# Patient Record
Sex: Male | Born: 1945 | Race: White | Hispanic: No | Marital: Married | Smoking: Former smoker
Health system: Southern US, Community
[De-identification: ages and names within clinical notes are randomized; demographics above are authoritative.]

## PROBLEM LIST (undated history)

## (undated) DIAGNOSIS — R42 Dizziness and giddiness: Secondary | ICD-10-CM

## (undated) DIAGNOSIS — Z974 Presence of external hearing-aid: Secondary | ICD-10-CM

## (undated) DIAGNOSIS — E785 Hyperlipidemia, unspecified: Secondary | ICD-10-CM

## (undated) DIAGNOSIS — J449 Chronic obstructive pulmonary disease, unspecified: Secondary | ICD-10-CM

## (undated) DIAGNOSIS — I10 Essential (primary) hypertension: Secondary | ICD-10-CM

## (undated) DIAGNOSIS — K219 Gastro-esophageal reflux disease without esophagitis: Secondary | ICD-10-CM

## (undated) DIAGNOSIS — Z972 Presence of dental prosthetic device (complete) (partial): Secondary | ICD-10-CM

## (undated) DIAGNOSIS — M1990 Unspecified osteoarthritis, unspecified site: Secondary | ICD-10-CM

## (undated) HISTORY — PX: COLONOSCOPY: SHX174

## (undated) HISTORY — PX: HERNIA REPAIR: SHX51

## (undated) HISTORY — PX: FOOT SURGERY: SHX648

## (undated) HISTORY — PX: CARDIAC CATHETERIZATION: SHX172

---

## 2003-11-16 ENCOUNTER — Ambulatory Visit: Payer: Self-pay | Admitting: Internal Medicine

## 2003-11-29 ENCOUNTER — Other Ambulatory Visit: Payer: Self-pay

## 2003-12-05 ENCOUNTER — Ambulatory Visit: Payer: Self-pay | Admitting: Unknown Physician Specialty

## 2005-07-18 ENCOUNTER — Ambulatory Visit: Payer: Self-pay | Admitting: Unknown Physician Specialty

## 2005-07-24 ENCOUNTER — Ambulatory Visit: Payer: Self-pay | Admitting: Cardiology

## 2007-12-01 ENCOUNTER — Ambulatory Visit: Payer: Self-pay | Admitting: Internal Medicine

## 2009-03-15 ENCOUNTER — Ambulatory Visit: Payer: Self-pay | Admitting: Gastroenterology

## 2010-03-29 ENCOUNTER — Ambulatory Visit: Payer: Self-pay | Admitting: Internal Medicine

## 2015-04-12 DIAGNOSIS — R739 Hyperglycemia, unspecified: Secondary | ICD-10-CM | POA: Diagnosis not present

## 2015-04-19 DIAGNOSIS — I2510 Atherosclerotic heart disease of native coronary artery without angina pectoris: Secondary | ICD-10-CM | POA: Diagnosis not present

## 2015-04-19 DIAGNOSIS — R739 Hyperglycemia, unspecified: Secondary | ICD-10-CM | POA: Diagnosis not present

## 2015-04-19 DIAGNOSIS — M25552 Pain in left hip: Secondary | ICD-10-CM | POA: Diagnosis not present

## 2015-04-19 DIAGNOSIS — I10 Essential (primary) hypertension: Secondary | ICD-10-CM | POA: Diagnosis not present

## 2015-04-19 DIAGNOSIS — J449 Chronic obstructive pulmonary disease, unspecified: Secondary | ICD-10-CM | POA: Diagnosis not present

## 2015-04-25 DIAGNOSIS — M25422 Effusion, left elbow: Secondary | ICD-10-CM | POA: Diagnosis not present

## 2015-04-25 DIAGNOSIS — M19022 Primary osteoarthritis, left elbow: Secondary | ICD-10-CM | POA: Diagnosis not present

## 2015-04-25 DIAGNOSIS — M7022 Olecranon bursitis, left elbow: Secondary | ICD-10-CM | POA: Diagnosis not present

## 2015-08-09 DIAGNOSIS — I10 Essential (primary) hypertension: Secondary | ICD-10-CM | POA: Diagnosis not present

## 2015-08-09 DIAGNOSIS — R739 Hyperglycemia, unspecified: Secondary | ICD-10-CM | POA: Diagnosis not present

## 2015-08-12 HISTORY — PX: SHOULDER ARTHROSCOPY WITH ROTATOR CUFF REPAIR: SHX5685

## 2015-08-22 DIAGNOSIS — Z125 Encounter for screening for malignant neoplasm of prostate: Secondary | ICD-10-CM | POA: Diagnosis not present

## 2015-08-22 DIAGNOSIS — J449 Chronic obstructive pulmonary disease, unspecified: Secondary | ICD-10-CM | POA: Diagnosis not present

## 2015-08-22 DIAGNOSIS — I2510 Atherosclerotic heart disease of native coronary artery without angina pectoris: Secondary | ICD-10-CM | POA: Diagnosis not present

## 2015-08-22 DIAGNOSIS — R739 Hyperglycemia, unspecified: Secondary | ICD-10-CM | POA: Diagnosis not present

## 2015-08-22 DIAGNOSIS — I10 Essential (primary) hypertension: Secondary | ICD-10-CM | POA: Diagnosis not present

## 2015-08-22 DIAGNOSIS — E7800 Pure hypercholesterolemia, unspecified: Secondary | ICD-10-CM | POA: Diagnosis not present

## 2015-08-22 DIAGNOSIS — Z1211 Encounter for screening for malignant neoplasm of colon: Secondary | ICD-10-CM | POA: Diagnosis not present

## 2015-08-22 DIAGNOSIS — Z0001 Encounter for general adult medical examination with abnormal findings: Secondary | ICD-10-CM | POA: Diagnosis not present

## 2015-08-23 DIAGNOSIS — X32XXXA Exposure to sunlight, initial encounter: Secondary | ICD-10-CM | POA: Diagnosis not present

## 2015-08-23 DIAGNOSIS — D0439 Carcinoma in situ of skin of other parts of face: Secondary | ICD-10-CM | POA: Diagnosis not present

## 2015-08-23 DIAGNOSIS — L570 Actinic keratosis: Secondary | ICD-10-CM | POA: Diagnosis not present

## 2015-08-23 DIAGNOSIS — D485 Neoplasm of uncertain behavior of skin: Secondary | ICD-10-CM | POA: Diagnosis not present

## 2015-08-23 DIAGNOSIS — D1801 Hemangioma of skin and subcutaneous tissue: Secondary | ICD-10-CM | POA: Diagnosis not present

## 2015-09-15 DIAGNOSIS — D0439 Carcinoma in situ of skin of other parts of face: Secondary | ICD-10-CM | POA: Diagnosis not present

## 2016-01-17 DIAGNOSIS — Z85828 Personal history of other malignant neoplasm of skin: Secondary | ICD-10-CM | POA: Diagnosis not present

## 2016-01-17 DIAGNOSIS — Z08 Encounter for follow-up examination after completed treatment for malignant neoplasm: Secondary | ICD-10-CM | POA: Diagnosis not present

## 2016-01-17 DIAGNOSIS — X32XXXA Exposure to sunlight, initial encounter: Secondary | ICD-10-CM | POA: Diagnosis not present

## 2016-01-17 DIAGNOSIS — L821 Other seborrheic keratosis: Secondary | ICD-10-CM | POA: Diagnosis not present

## 2016-01-17 DIAGNOSIS — L570 Actinic keratosis: Secondary | ICD-10-CM | POA: Diagnosis not present

## 2016-02-21 DIAGNOSIS — J449 Chronic obstructive pulmonary disease, unspecified: Secondary | ICD-10-CM | POA: Diagnosis not present

## 2016-02-21 DIAGNOSIS — I2510 Atherosclerotic heart disease of native coronary artery without angina pectoris: Secondary | ICD-10-CM | POA: Diagnosis not present

## 2016-02-21 DIAGNOSIS — I10 Essential (primary) hypertension: Secondary | ICD-10-CM | POA: Diagnosis not present

## 2016-02-21 DIAGNOSIS — R739 Hyperglycemia, unspecified: Secondary | ICD-10-CM | POA: Diagnosis not present

## 2016-02-21 DIAGNOSIS — Z125 Encounter for screening for malignant neoplasm of prostate: Secondary | ICD-10-CM | POA: Diagnosis not present

## 2016-03-25 DIAGNOSIS — R070 Pain in throat: Secondary | ICD-10-CM | POA: Diagnosis not present

## 2016-03-25 DIAGNOSIS — J039 Acute tonsillitis, unspecified: Secondary | ICD-10-CM | POA: Diagnosis not present

## 2016-09-19 DIAGNOSIS — I2510 Atherosclerotic heart disease of native coronary artery without angina pectoris: Secondary | ICD-10-CM | POA: Diagnosis not present

## 2016-09-27 DIAGNOSIS — R748 Abnormal levels of other serum enzymes: Secondary | ICD-10-CM | POA: Diagnosis not present

## 2016-09-27 DIAGNOSIS — Z0001 Encounter for general adult medical examination with abnormal findings: Secondary | ICD-10-CM | POA: Diagnosis not present

## 2016-09-27 DIAGNOSIS — I2510 Atherosclerotic heart disease of native coronary artery without angina pectoris: Secondary | ICD-10-CM | POA: Diagnosis not present

## 2017-01-01 DIAGNOSIS — E7800 Pure hypercholesterolemia, unspecified: Secondary | ICD-10-CM | POA: Diagnosis not present

## 2017-01-01 DIAGNOSIS — J449 Chronic obstructive pulmonary disease, unspecified: Secondary | ICD-10-CM | POA: Diagnosis not present

## 2017-01-01 DIAGNOSIS — Z8679 Personal history of other diseases of the circulatory system: Secondary | ICD-10-CM | POA: Diagnosis not present

## 2017-01-01 DIAGNOSIS — I10 Essential (primary) hypertension: Secondary | ICD-10-CM | POA: Diagnosis not present

## 2017-01-13 DIAGNOSIS — J019 Acute sinusitis, unspecified: Secondary | ICD-10-CM | POA: Diagnosis not present

## 2017-01-15 DIAGNOSIS — Z85828 Personal history of other malignant neoplasm of skin: Secondary | ICD-10-CM | POA: Diagnosis not present

## 2017-01-15 DIAGNOSIS — X32XXXA Exposure to sunlight, initial encounter: Secondary | ICD-10-CM | POA: Diagnosis not present

## 2017-01-15 DIAGNOSIS — D0462 Carcinoma in situ of skin of left upper limb, including shoulder: Secondary | ICD-10-CM | POA: Diagnosis not present

## 2017-01-15 DIAGNOSIS — D485 Neoplasm of uncertain behavior of skin: Secondary | ICD-10-CM | POA: Diagnosis not present

## 2017-01-15 DIAGNOSIS — L570 Actinic keratosis: Secondary | ICD-10-CM | POA: Diagnosis not present

## 2017-01-15 DIAGNOSIS — L821 Other seborrheic keratosis: Secondary | ICD-10-CM | POA: Diagnosis not present

## 2017-01-15 DIAGNOSIS — Z08 Encounter for follow-up examination after completed treatment for malignant neoplasm: Secondary | ICD-10-CM | POA: Diagnosis not present

## 2017-01-22 DIAGNOSIS — D045 Carcinoma in situ of skin of trunk: Secondary | ICD-10-CM | POA: Diagnosis not present

## 2017-09-05 ENCOUNTER — Other Ambulatory Visit: Payer: Self-pay | Admitting: Family Medicine

## 2017-09-05 DIAGNOSIS — R748 Abnormal levels of other serum enzymes: Secondary | ICD-10-CM

## 2017-09-08 ENCOUNTER — Other Ambulatory Visit: Payer: Self-pay | Admitting: Family Medicine

## 2017-09-08 DIAGNOSIS — R748 Abnormal levels of other serum enzymes: Secondary | ICD-10-CM

## 2017-09-12 ENCOUNTER — Ambulatory Visit
Admission: RE | Admit: 2017-09-12 | Discharge: 2017-09-12 | Disposition: A | Discharge status: Home / Self Care | Payer: Medicare Other | Source: Ambulatory Visit | Attending: Family Medicine | Admitting: Family Medicine

## 2017-09-12 DIAGNOSIS — R748 Abnormal levels of other serum enzymes: Secondary | ICD-10-CM | POA: Insufficient documentation

## 2017-11-13 ENCOUNTER — Encounter: Payer: Self-pay | Admitting: *Deleted

## 2017-11-14 ENCOUNTER — Encounter
Admission: RE | Disposition: A | Discharge status: Home / Self Care | Payer: Self-pay | Source: Ambulatory Visit | Attending: Gastroenterology

## 2017-11-14 ENCOUNTER — Ambulatory Visit: Payer: Medicare Other | Admitting: Anesthesiology

## 2017-11-14 ENCOUNTER — Encounter: Payer: Self-pay | Admitting: *Deleted

## 2017-11-14 ENCOUNTER — Ambulatory Visit
Admission: RE | Admit: 2017-11-14 | Discharge: 2017-11-14 | Disposition: A | Discharge status: Home / Self Care | Payer: Medicare Other | Source: Ambulatory Visit | Attending: Gastroenterology | Admitting: Gastroenterology

## 2017-11-14 DIAGNOSIS — Z7982 Long term (current) use of aspirin: Secondary | ICD-10-CM | POA: Diagnosis not present

## 2017-11-14 DIAGNOSIS — Z79899 Other long term (current) drug therapy: Secondary | ICD-10-CM | POA: Diagnosis not present

## 2017-11-14 DIAGNOSIS — K219 Gastro-esophageal reflux disease without esophagitis: Secondary | ICD-10-CM | POA: Insufficient documentation

## 2017-11-14 DIAGNOSIS — Z800 Family history of malignant neoplasm of digestive organs: Secondary | ICD-10-CM | POA: Insufficient documentation

## 2017-11-14 DIAGNOSIS — D125 Benign neoplasm of sigmoid colon: Secondary | ICD-10-CM | POA: Insufficient documentation

## 2017-11-14 DIAGNOSIS — D123 Benign neoplasm of transverse colon: Secondary | ICD-10-CM | POA: Diagnosis not present

## 2017-11-14 DIAGNOSIS — Z1211 Encounter for screening for malignant neoplasm of colon: Secondary | ICD-10-CM | POA: Diagnosis not present

## 2017-11-14 DIAGNOSIS — Z87891 Personal history of nicotine dependence: Secondary | ICD-10-CM | POA: Insufficient documentation

## 2017-11-14 DIAGNOSIS — I10 Essential (primary) hypertension: Secondary | ICD-10-CM | POA: Diagnosis not present

## 2017-11-14 DIAGNOSIS — J449 Chronic obstructive pulmonary disease, unspecified: Secondary | ICD-10-CM | POA: Diagnosis not present

## 2017-11-14 DIAGNOSIS — E785 Hyperlipidemia, unspecified: Secondary | ICD-10-CM | POA: Diagnosis not present

## 2017-11-14 DIAGNOSIS — K5730 Diverticulosis of large intestine without perforation or abscess without bleeding: Secondary | ICD-10-CM | POA: Insufficient documentation

## 2017-11-14 HISTORY — DX: Gastro-esophageal reflux disease without esophagitis: K21.9

## 2017-11-14 HISTORY — DX: Hyperlipidemia, unspecified: E78.5

## 2017-11-14 HISTORY — DX: Unspecified osteoarthritis, unspecified site: M19.90

## 2017-11-14 HISTORY — DX: Essential (primary) hypertension: I10

## 2017-11-14 HISTORY — PX: COLONOSCOPY WITH PROPOFOL: SHX5780

## 2017-11-14 HISTORY — DX: Chronic obstructive pulmonary disease, unspecified: J44.9

## 2017-11-14 SURGERY — COLONOSCOPY WITH PROPOFOL
Anesthesia: General

## 2017-11-14 MED ORDER — PROPOFOL 500 MG/50ML IV EMUL
INTRAVENOUS | Status: AC
  Filled 2017-11-14: qty 50

## 2017-11-14 MED ORDER — PROPOFOL 500 MG/50ML IV EMUL
INTRAVENOUS | Status: DC | PRN
  Administered 2017-11-14: 180 ug/kg/min via INTRAVENOUS

## 2017-11-14 MED ORDER — OXYMETAZOLINE HCL 0.05 % NA SOLN
NASAL | Status: AC
  Filled 2017-11-14: qty 15

## 2017-11-14 MED ORDER — FENTANYL CITRATE (PF) 100 MCG/2ML IJ SOLN
INTRAMUSCULAR | Status: AC
  Filled 2017-11-14: qty 2

## 2017-11-14 MED ORDER — LIDOCAINE HCL (PF) 1 % IJ SOLN
2.0000 mL | Freq: Once | INTRAMUSCULAR | Status: AC
  Administered 2017-11-14: 0.3 mL via INTRADERMAL

## 2017-11-14 MED ORDER — LIDOCAINE HCL (PF) 2 % IJ SOLN
INTRAMUSCULAR | Status: AC
  Filled 2017-11-14: qty 10

## 2017-11-14 MED ORDER — FENTANYL CITRATE (PF) 100 MCG/2ML IJ SOLN
INTRAMUSCULAR | Status: DC | PRN
  Administered 2017-11-14 (×2): 50 ug via INTRAVENOUS

## 2017-11-14 MED ORDER — PROPOFOL 10 MG/ML IV BOLUS
INTRAVENOUS | Status: DC | PRN
  Administered 2017-11-14: 100 mg via INTRAVENOUS

## 2017-11-14 MED ORDER — SODIUM CHLORIDE 0.9 % IV SOLN
INTRAVENOUS | Status: DC
  Administered 2017-11-14: 1000 mL via INTRAVENOUS
  Administered 2017-11-14: 10:00:00 via INTRAVENOUS

## 2017-11-14 MED ORDER — LIDOCAINE 2% (20 MG/ML) 5 ML SYRINGE
INTRAMUSCULAR | Status: DC | PRN
  Administered 2017-11-14: 40 mg via INTRAVENOUS

## 2017-11-14 MED ORDER — LIDOCAINE HCL (PF) 1 % IJ SOLN
INTRAMUSCULAR | Status: AC
  Administered 2017-11-14: 0.3 mL via INTRADERMAL
  Filled 2017-11-14: qty 2

## 2017-11-14 NOTE — Anesthesia Post-op Follow-up Note (Signed)
Anesthesia QCDR form completed.        

## 2017-11-14 NOTE — H&P (Signed)
Outpatient short stay form Pre-procedure 11/14/2017 10:08 AM John Sails MD  Primary Physician: Dr Dion Body  Reason for visit: Colonoscopy  History of present illness: Patient is a 72 year old male presenting today as above.  There is a family history of colon cancer in a primary relative, brother.  Patient himself has never had polyps on previous procedures.  He tolerated his prep well.  He does take a daily 81 mg aspirin that he has not taken for the past 2 days.  He denies use of any other aspirin products or blood thinning agent.    Current Facility-Administered Medications:  .  0.9 %  sodium chloride infusion, , Intravenous, Continuous, John Sails, MD, Last Rate: 20 mL/hr at 11/14/17 0908  Medications Prior to Admission  Medication Sig Dispense Refill Last Dose  . amLODipine (NORVASC) 10 MG tablet Take 10 mg by mouth daily.     Marland Kitchen aspirin EC 81 MG tablet Take 81 mg by mouth daily.   11/12/2017  . HM OMEGA-3-6-9 FATTY ACIDS CAPS Take 1,000 mg by mouth daily.     Marland Kitchen losartan (COZAAR) 100 MG tablet Take 100 mg by mouth daily.     . simvastatin (ZOCOR) 10 MG tablet Take 10 mg by mouth daily.        Allergies  Allergen Reactions  . Shellfish Allergy      Past Medical History:  Diagnosis Date  . Arthritis   . COPD (chronic obstructive pulmonary disease) ([place])   . GERD (gastroesophageal reflux disease)   . Hyperlipidemia   . Hypertension     Review of systems:      Physical Exam    Heart and lungs: Regular rate and rhythm without rub or gallop lungs are bilaterally clear    HEENT: Normocephalic atraumatic eyes are anicteric    Other:    Pertinant exam for procedure: Soft nontender nondistended bowel sounds positive normoactive    Planned proceedures: Colonoscopy and indicated procedures. I have discussed the risks benefits and complications of procedures to include not limited to bleeding, infection, perforation and the risk of sedation and the  patient wishes to proceed.    John Sails, MD Gastroenterology 11/14/2017  10:08 AM

## 2017-11-14 NOTE — Op Note (Addendum)
[facility] Gastroenterology Patient Name: John Mueller Procedure Date: 11/14/2017 10:14 AM MRN: [id number] Account #: [id number] Date of Birth: [birth year] Admit Type: Outpatient Age: 72 Room: [facility] ENDO ROOM 3 Gender: Male Note Status: Finalized Procedure:            Colonoscopy Indications:          Family history of colon cancer in a first-degree                        relative Providers:            Lollie Sails, MD Referring MD:         Dion Body (Referring MD) Medicines:            Monitored Anesthesia Care Complications:        No immediate complications. Procedure:            Pre-Anesthesia Assessment:                       - ASA Grade Assessment: III - A patient with severe                        systemic disease.                       After obtaining informed consent, the colonoscope was                        passed under direct vision. Throughout the procedure,                        the patient's blood pressure, pulse, and oxygen                        saturations were monitored continuously. The                        Colonoscope was introduced through the anus and                        advanced to the the cecum, identified by appendiceal                        orifice and ileocecal valve. The colonoscopy was                        performed with moderate difficulty. The patient                        tolerated the procedure well. The quality of the bowel                        preparation was good. Findings:      Three semi-pedunculated polyps were found in the transverse colon. The       polyps were 7 to 10 mm in size. These polyps were removed with a cold       snare. Resection and retrieval were complete.      Four sessile and semi-pedunculated polyps were found in the proximal       ascending colon. The polyps were 2 to 12 mm in size. These polyps were  removed with a cold snare. Resection and retrieval were complete.      A 2  mm polyp was found in the transverse colon. The polyp was sessile.       The polyp was removed with a cold biopsy forceps. Resection and       retrieval were complete.      A 5 mm polyp was found in the proximal sigmoid colon. The polyp was       pedunculated. The polyp was removed with a cold snare. Resection and       retrieval were complete.      Many small and large-mouthed diverticula were found in the sigmoid colon       and descending colon.      The retroflexed view of the distal rectum and anal verge was normal and       showed no anal or rectal abnormalities.      The digital rectal exam was normal. Impression:           - Three 7 to 10 mm polyps in the transverse colon,                        removed with a cold snare. Resected and retrieved.                       - Four 2 to 12 mm polyps in the proximal ascending                        colon, removed with a cold snare. Resected and                        retrieved.                       - One 2 mm polyp in the transverse colon, removed with                        a cold biopsy forceps. Resected and retrieved.                       - One 5 mm polyp in the proximal sigmoid colon, removed                        with a cold snare. Resected and retrieved.                       - Diverticulosis in the sigmoid colon and in the                        descending colon.                       - The distal rectum and anal verge are normal on                        retroflexion view. Recommendation:       - Await pathology results.                       - Telephone GI clinic for pathology results in 1 week. Procedure Code(s):    --- Professional ---  [phone], Colonoscopy, flexible; with removal of tumor(s),                        polyp(s), or other lesion(s) by snare technique                       45380, 59, Colonoscopy, flexible; with biopsy, single                        or multiple CPT copyright 2017 American  Medical Association. All rights reserved. The codes documented in this report are preliminary and upon coder review may  be revised to meet current compliance requirements. Lollie Sails, MD 11/14/2017 11:04:14 AM This report has been signed electronically. Number of Addenda: 0 Note Initiated On: 11/14/2017 10:14 AM Scope Withdrawal Time: 0 hours 23 minutes 46 seconds  Total Procedure Duration: 0 hours 37 minutes 7 seconds       [facility]

## 2017-11-14 NOTE — Transfer of Care (Signed)
Immediate Anesthesia Transfer of Care Note  Patient: John Mueller  Procedure(s) Performed: COLONOSCOPY WITH PROPOFOL (N/A )  Patient Location: PACU and Endoscopy Unit  Anesthesia Type:General  Level of Consciousness: drowsy  Airway & Oxygen Therapy: Patient Spontanous Breathing and Patient connected to nasal cannula oxygen  Post-op Assessment: Report given to RN and Post -op Vital signs reviewed and stable  Post vital signs: Reviewed and stable  Last Vitals:  Vitals Value Taken Time  BP    Temp    Pulse    Resp    SpO2      Last Pain:  Vitals:   11/14/17 0856  TempSrc: Tympanic  PainSc: 0-No pain         Complications: No apparent anesthesia complications

## 2017-11-14 NOTE — Anesthesia Preprocedure Evaluation (Signed)
Anesthesia Evaluation  Patient identified by MRN, date of birth, ID band Patient awake    Reviewed: Allergy & Precautions, H&P , NPO status , Patient's Chart, lab work & pertinent test results, reviewed documented beta blocker date and time   History of Anesthesia Complications Negative for: history of anesthetic complications  Airway Mallampati: I  TM Distance: >3 FB Neck ROM: full    Dental  (+) Dental Advidsory Given, Edentulous Upper, Edentulous Lower, Upper Dentures, Lower Dentures   Pulmonary neg shortness of breath, COPD, neg recent URI, former smoker,           Cardiovascular Exercise Tolerance: Good hypertension, (-) angina(-) CAD, (-) Past MI, (-) Cardiac Stents and (-) CABG (-) dysrhythmias (-) Valvular Problems/Murmurs     Neuro/Psych negative neurological ROS  negative psych ROS   GI/Hepatic Neg liver ROS, GERD  ,  Endo/Other  negative endocrine ROS  Renal/GU negative Renal ROS  negative genitourinary   Musculoskeletal   Abdominal   Peds  Hematology negative hematology ROS (+)   Anesthesia Other Findings Past Medical History: No date: Arthritis No date: COPD (chronic obstructive pulmonary disease) ([facility]) No date: GERD (gastroesophageal reflux disease) No date: Hyperlipidemia No date: Hypertension   Reproductive/Obstetrics negative OB ROS                             Anesthesia Physical Anesthesia Plan  ASA: II  Anesthesia Plan: General   Post-op Pain Management:    Induction: Intravenous  PONV Risk Score and Plan: 2 and Propofol infusion and TIVA  Airway Management Planned: Natural Airway and Nasal Cannula  Additional Equipment:   Intra-op Plan:   Post-operative Plan:   Informed Consent: I have reviewed the patients History and Physical, chart, labs and discussed the procedure including the risks, benefits and alternatives for the proposed anesthesia with the  patient or authorized representative who has indicated his/her understanding and acceptance.   Dental Advisory Given  Plan Discussed with: Anesthesiologist, CRNA and Surgeon  Anesthesia Plan Comments:         Anesthesia Quick Evaluation

## 2017-11-15 ENCOUNTER — Encounter: Payer: Self-pay | Admitting: Gastroenterology

## 2017-11-16 NOTE — Anesthesia Postprocedure Evaluation (Signed)
Anesthesia Post Note  Patient: MIROSLAV GIN  Procedure(s) Performed: COLONOSCOPY WITH PROPOFOL (N/A )  Patient location during evaluation: Endoscopy Anesthesia Type: General Level of consciousness: awake and alert Pain management: pain level controlled Vital Signs Assessment: post-procedure vital signs reviewed and stable Respiratory status: spontaneous breathing, nonlabored ventilation, respiratory function stable and patient connected to nasal cannula oxygen Cardiovascular status: blood pressure returned to baseline and stable Postop Assessment: no apparent nausea or vomiting Anesthetic complications: no     Last Vitals:  Vitals:   11/14/17 1137 11/14/17 1147  BP: (!) 160/90 (!) 157/93  Pulse: (!) 54 (!) 55  Resp: 10 13  Temp:    SpO2: 100% 100%    Last Pain:  Vitals:   11/15/17 1206  TempSrc:   PainSc: 0-No pain                 Martha Clan

## 2017-11-18 LAB — SURGICAL PATHOLOGY

## 2018-11-21 IMAGING — US US ABDOMEN LIMITED
1 series · 14 of 25 positions shown · non-contrast
Comparison: None.

CLINICAL DATA: Elevated liver enzymes.

EXAM:
ULTRASOUND ABDOMEN LIMITED RIGHT UPPER QUADRANT

[Series 1: us abdomen limited · 0.25mm/px · 14 of 99 slices shown]
[im 1/99]
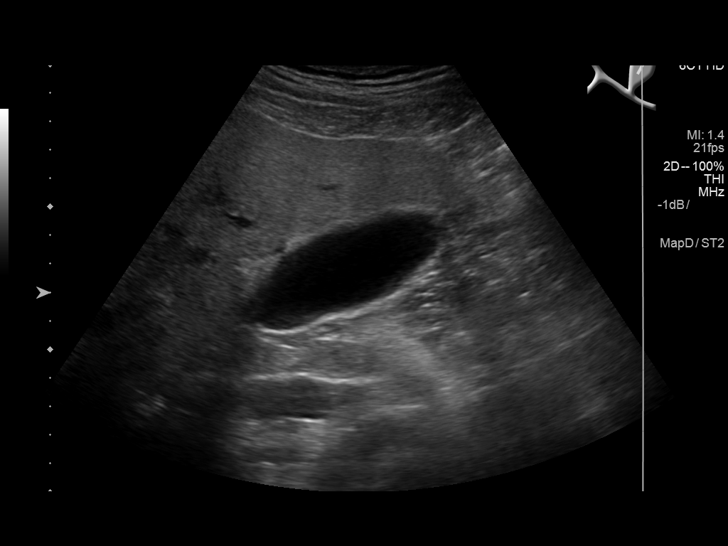
[im 9/99]
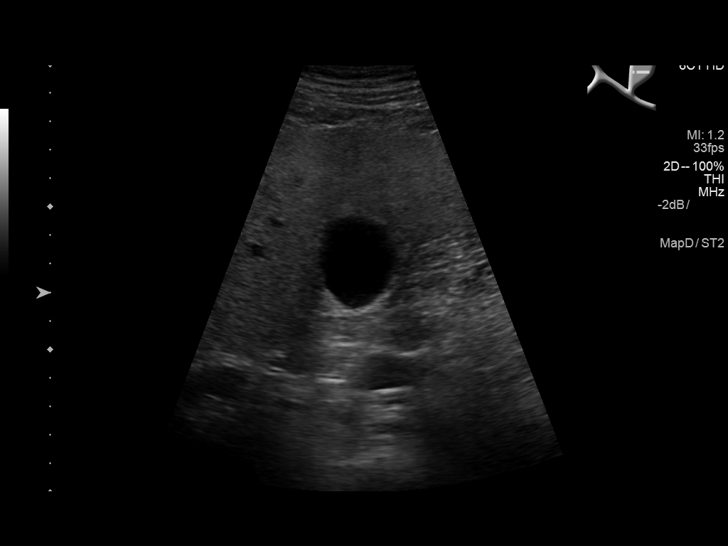
[im 17/99]
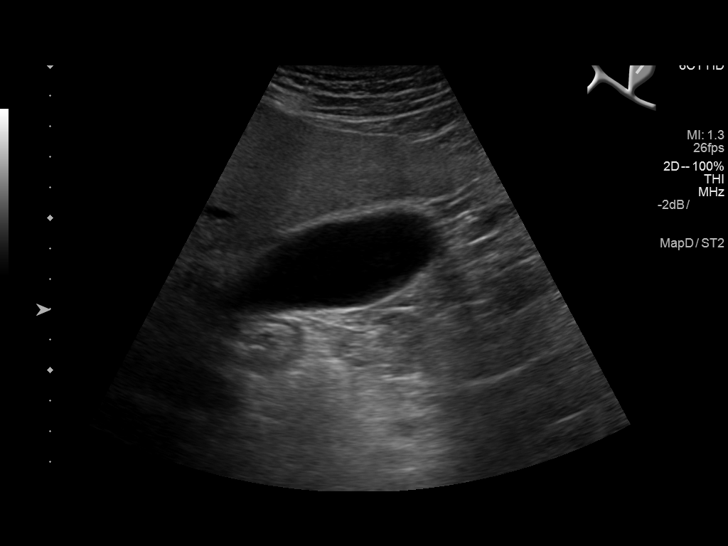
[im 25/99]
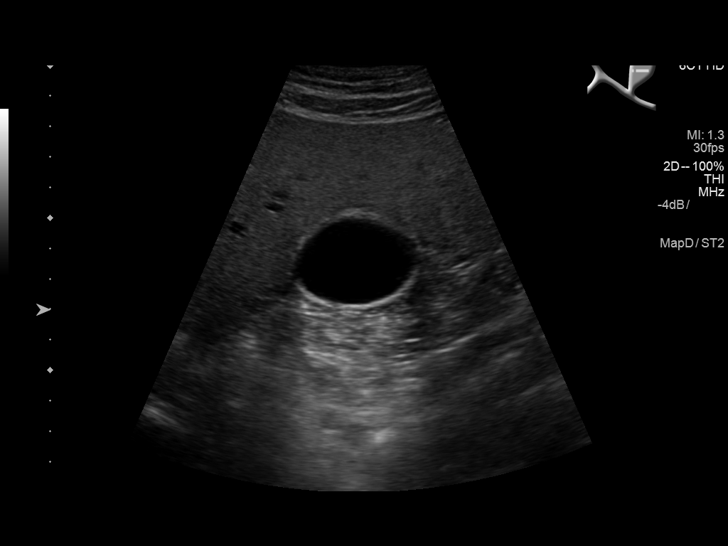
[im 33/99]
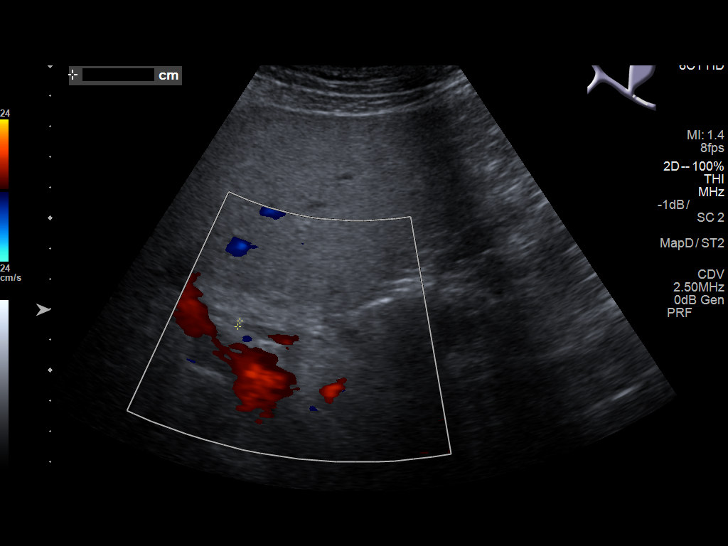
[im 37/99]
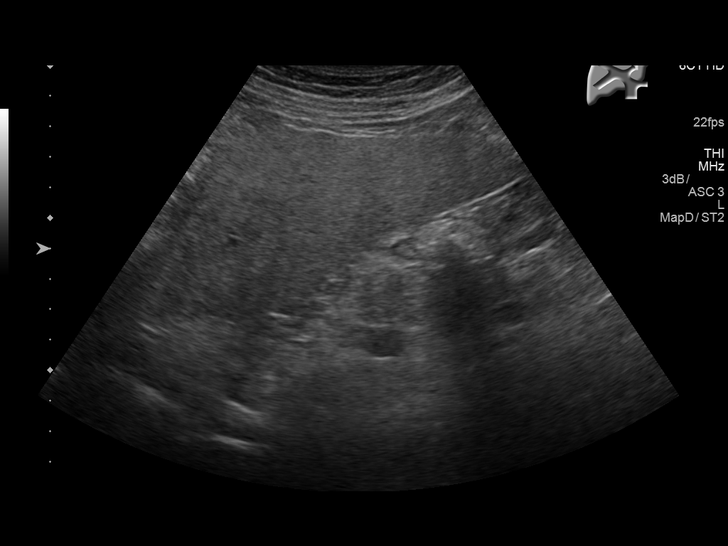
[im 45/99]
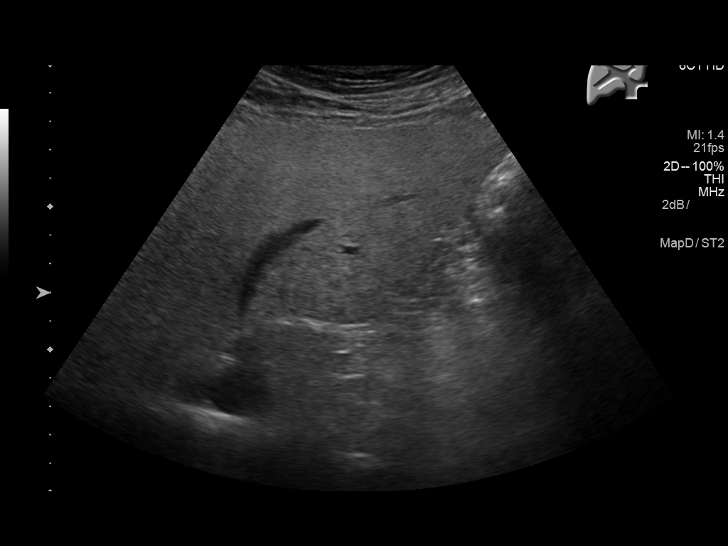
[im 54/99]
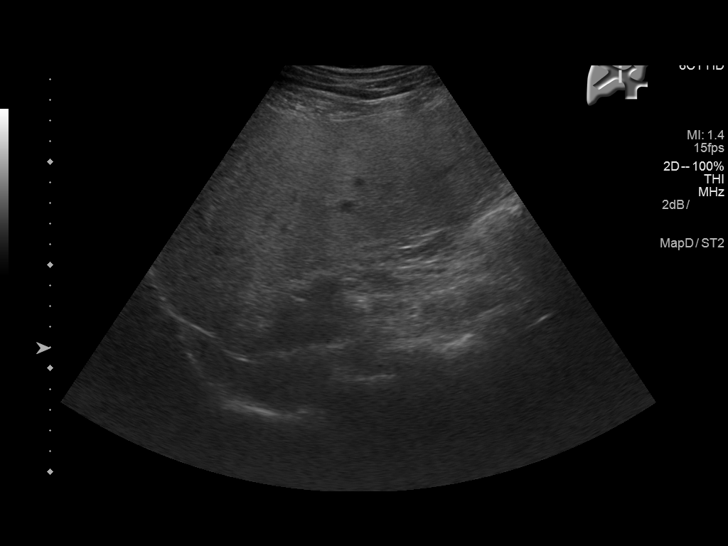
[im 62/99]
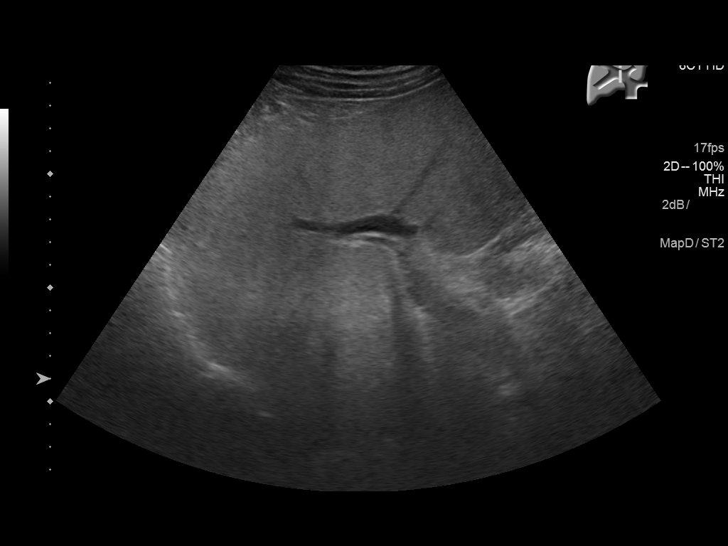
[im 66/99]
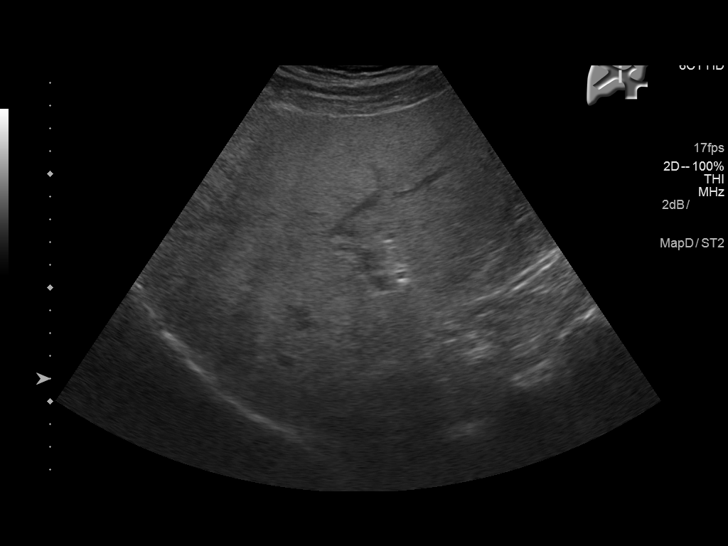
[im 74/99]
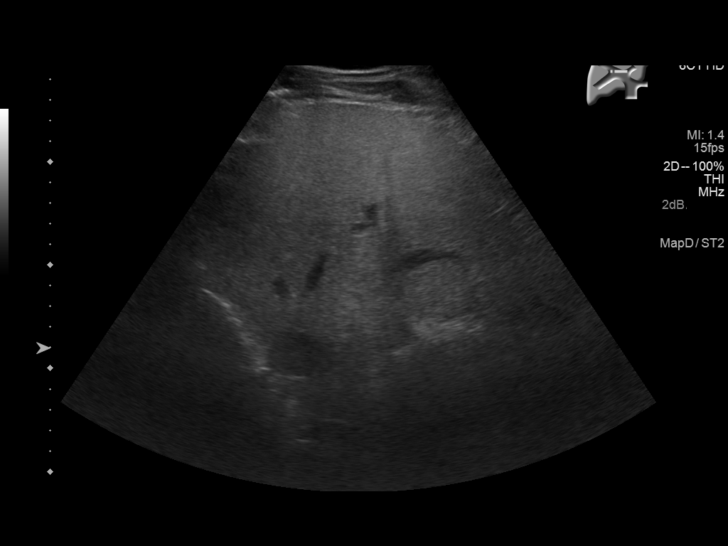
[im 82/99]
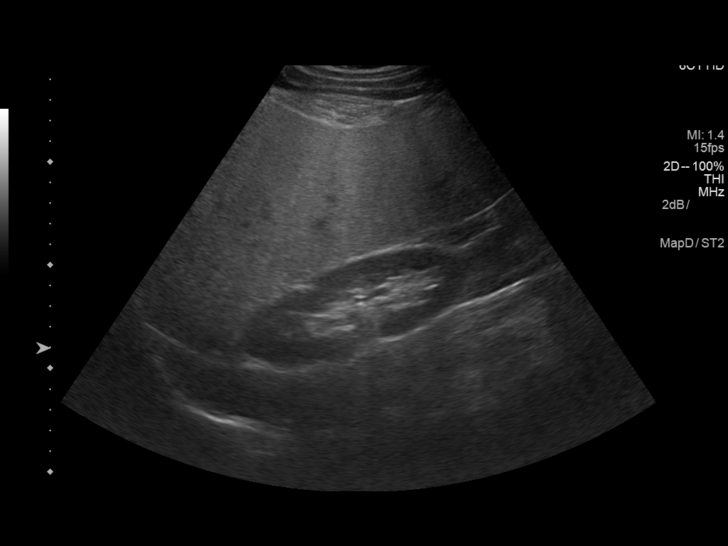
[im 90/99]
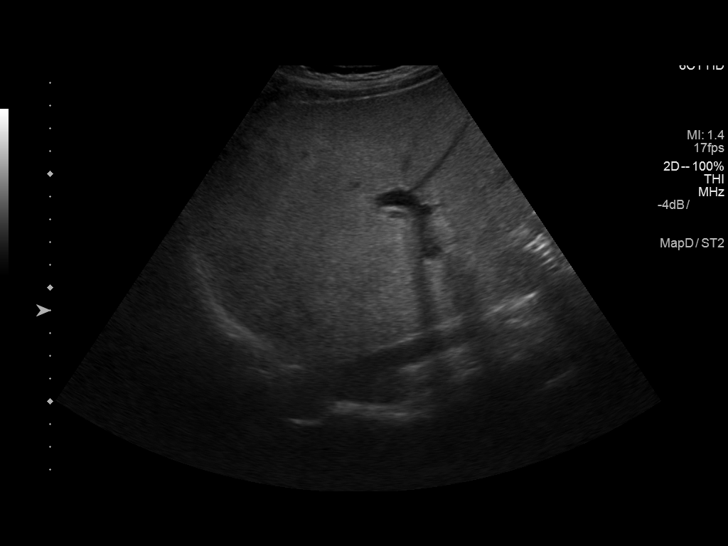
[im 99/99]
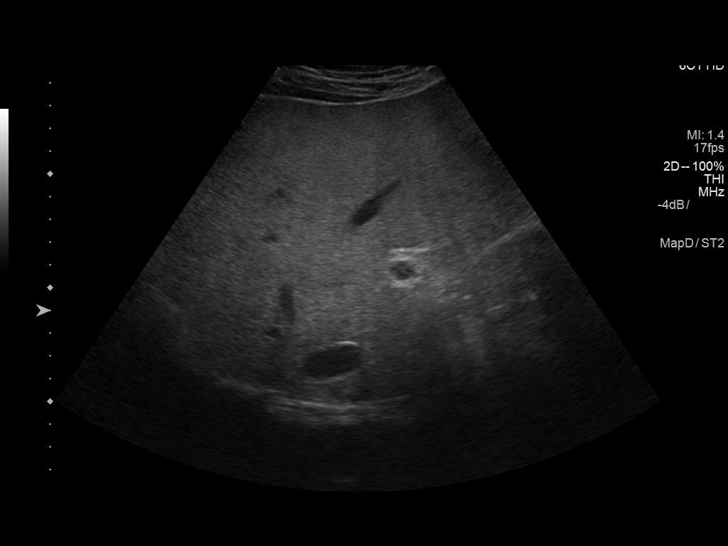

[14 of 25 positions shown; findings below may reference images not displayed]

FINDINGS: Gallbladder:

No gallstones or wall thickening visualized. No sonographic Murphy
sign noted by sonographer.

Common bile duct:

Diameter: 3 mm which is within normal limits.

Liver:

No focal lesion identified. Increased echogenicity of hepatic
parenchyma is noted suggesting fatty infiltration or other diffuse
hepatocellular disease. Portal vein is patent on color Doppler
imaging with normal direction of blood flow towards the liver.
IMPRESSION: Increased echogenicity of hepatic parenchyma is noted suggesting
fatty infiltration or other diffuse hepatocellular disease. No other
abnormality seen in the right upper quadrant of the abdomen.

## 2018-12-31 ENCOUNTER — Other Ambulatory Visit: Payer: Self-pay | Admitting: Family Medicine

## 2018-12-31 DIAGNOSIS — Z87891 Personal history of nicotine dependence: Secondary | ICD-10-CM

## 2018-12-31 DIAGNOSIS — Z0000 Encounter for general adult medical examination without abnormal findings: Secondary | ICD-10-CM

## 2019-01-11 ENCOUNTER — Ambulatory Visit
Admission: RE | Admit: 2019-01-11 | Discharge: 2019-01-11 | Disposition: A | Discharge status: Home / Self Care | Payer: Medicare Other | Source: Ambulatory Visit | Attending: Family Medicine | Admitting: Family Medicine

## 2019-01-11 ENCOUNTER — Other Ambulatory Visit: Payer: Self-pay

## 2019-01-11 DIAGNOSIS — Z87891 Personal history of nicotine dependence: Secondary | ICD-10-CM | POA: Diagnosis not present

## 2019-01-11 DIAGNOSIS — I714 Abdominal aortic aneurysm, without rupture: Secondary | ICD-10-CM | POA: Insufficient documentation

## 2019-01-11 DIAGNOSIS — Z0000 Encounter for general adult medical examination without abnormal findings: Secondary | ICD-10-CM

## 2019-01-11 DIAGNOSIS — Z136 Encounter for screening for cardiovascular disorders: Secondary | ICD-10-CM | POA: Insufficient documentation

## 2019-12-29 IMAGING — US US ABDOMINAL AORTA SCREENING AAA
1 series · 14 of 24 positions shown · non-contrast
Comparison: None.

CLINICAL DATA: Male between 65-75 years of age with a smoking
history.

EXAM:
US ABDOMINAL AORTA MEDICARE SCREENING
TECHNIQUE: Ultrasound examination of the abdominal aorta was performed as a
screening evaluation for abdominal aortic aneurysm.

[Series 1: us abdominal aorta screening aaa · 14 of 24 slices shown]
[im 1/24]
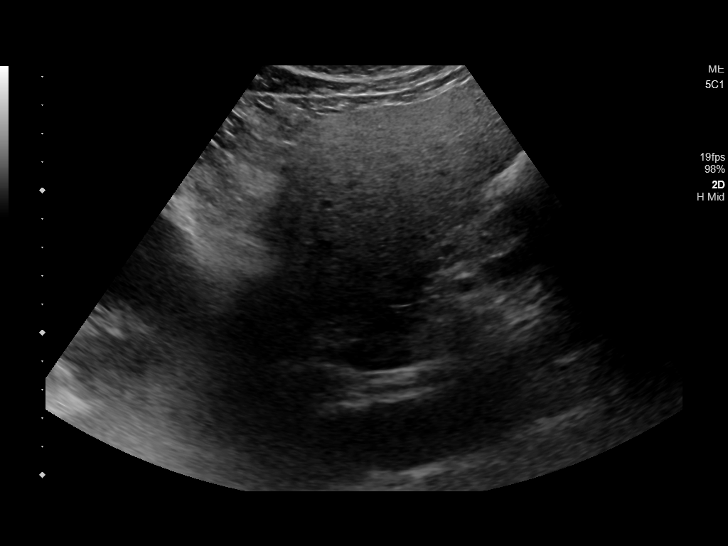
[im 3/24]
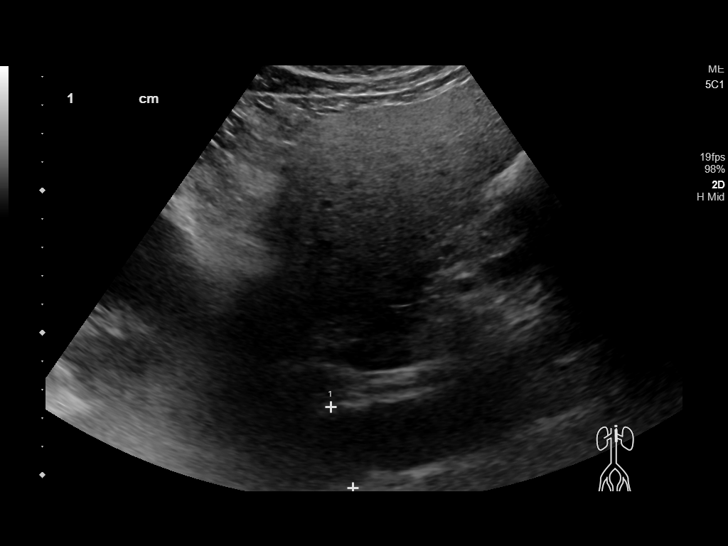
[im 5/24]
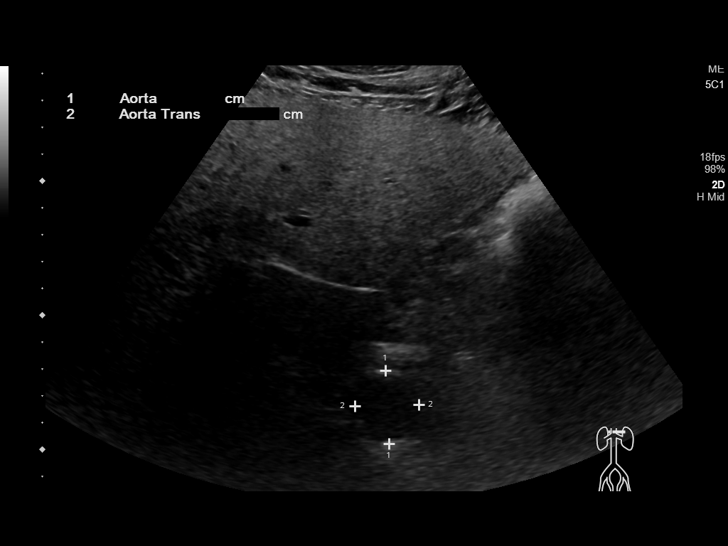
[im 7/24]
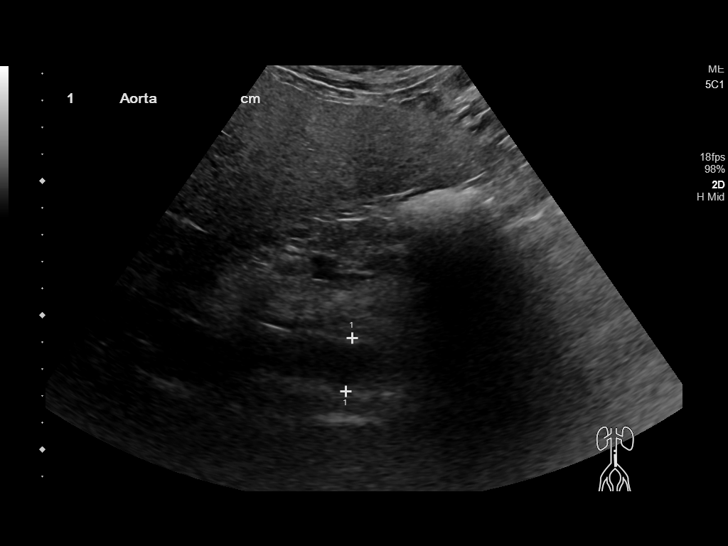
[im 8/24]
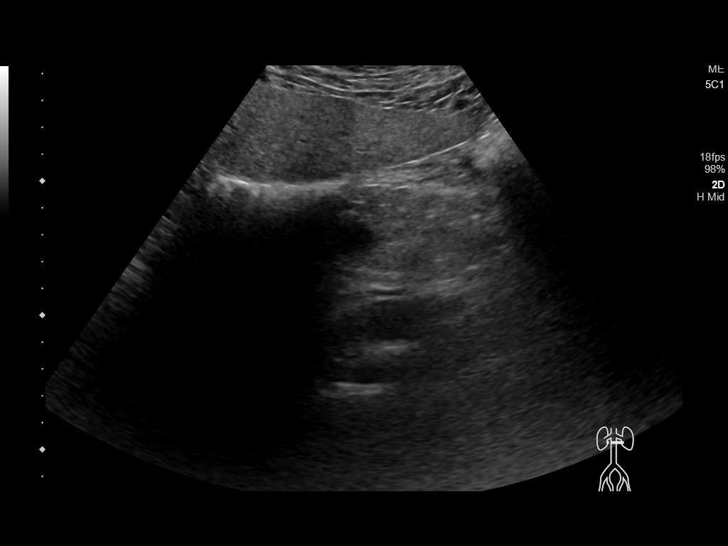
[im 10/24]
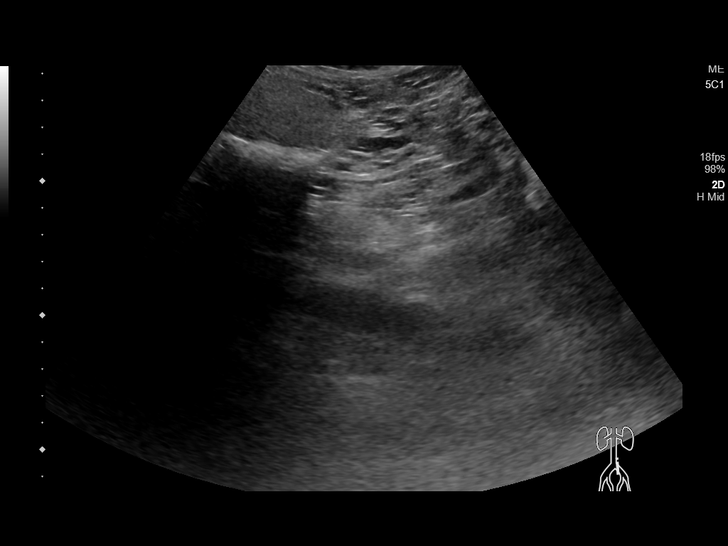
[im 12/24]
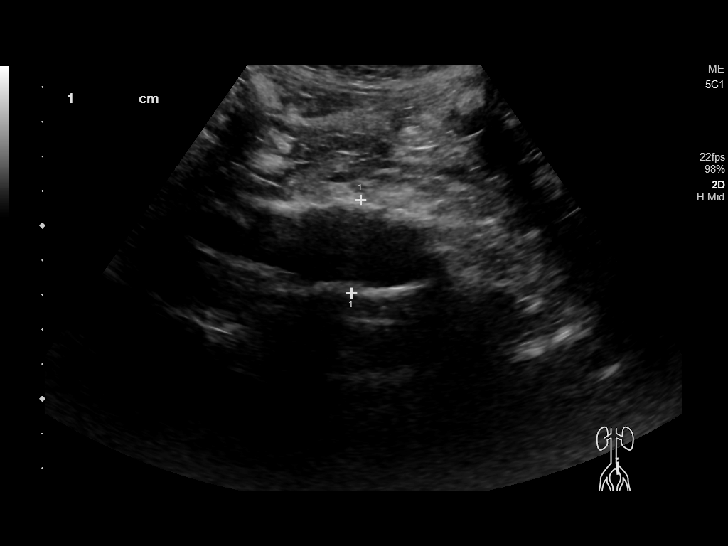
[im 13/24]
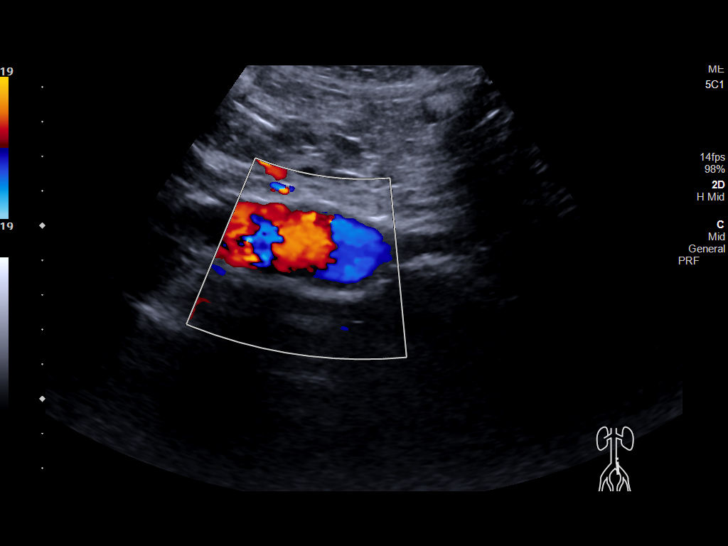
[im 15/24]
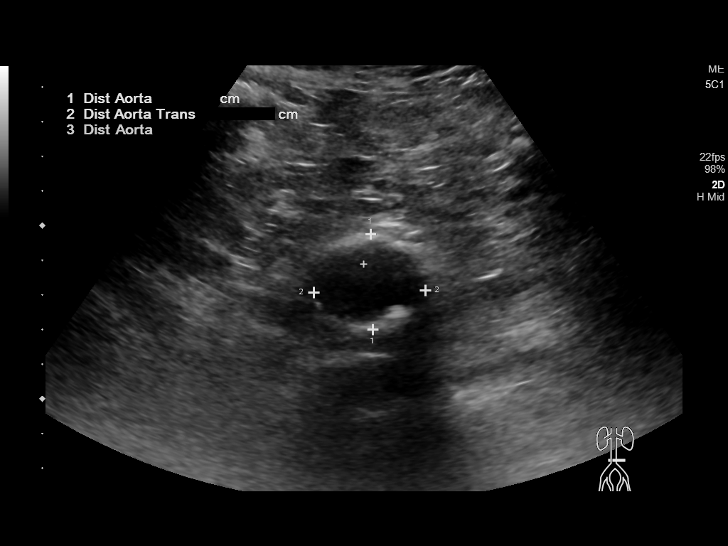
[im 17/24]
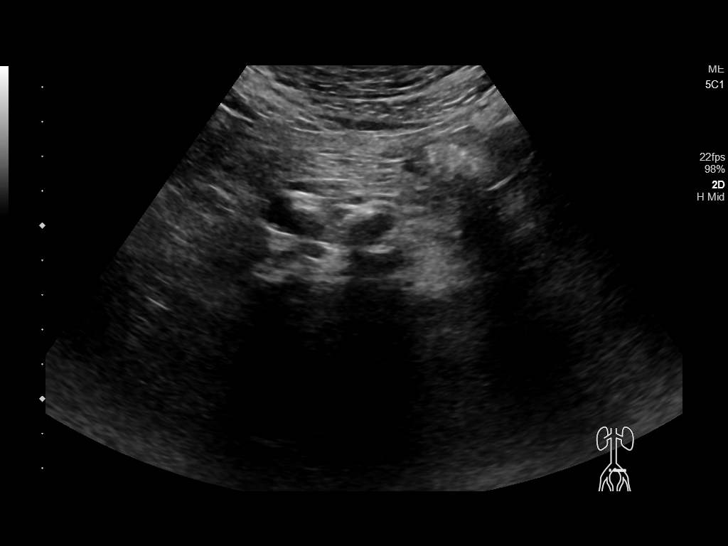
[im 19/24]
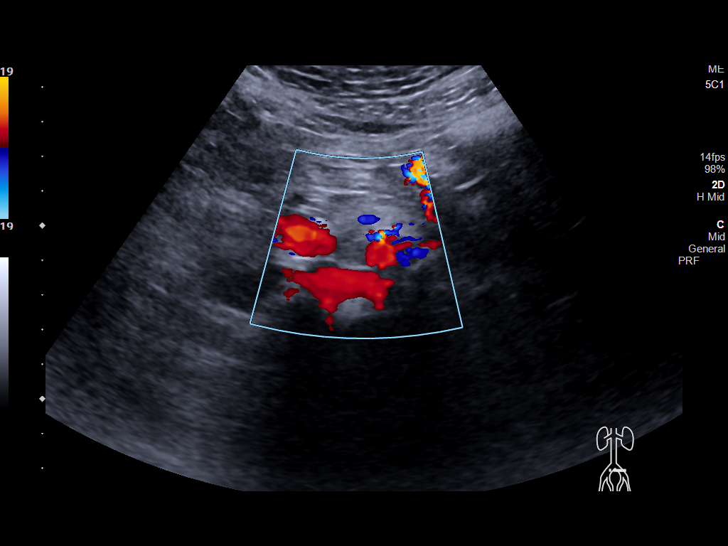
[im 20/24]
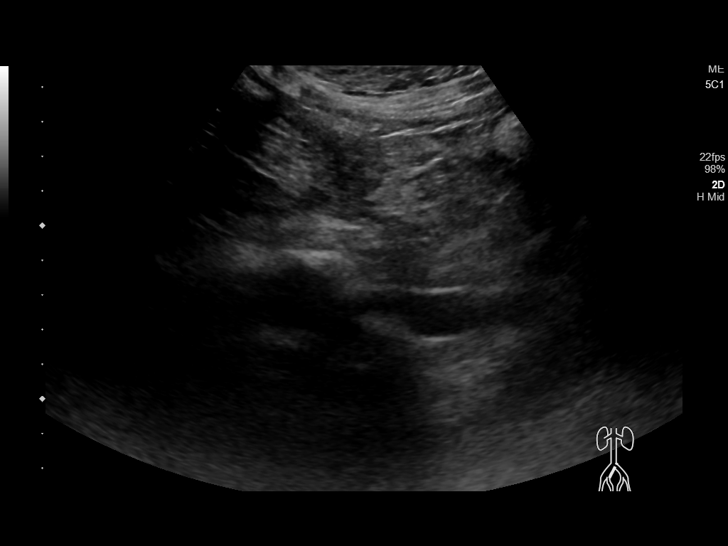
[im 22/24]
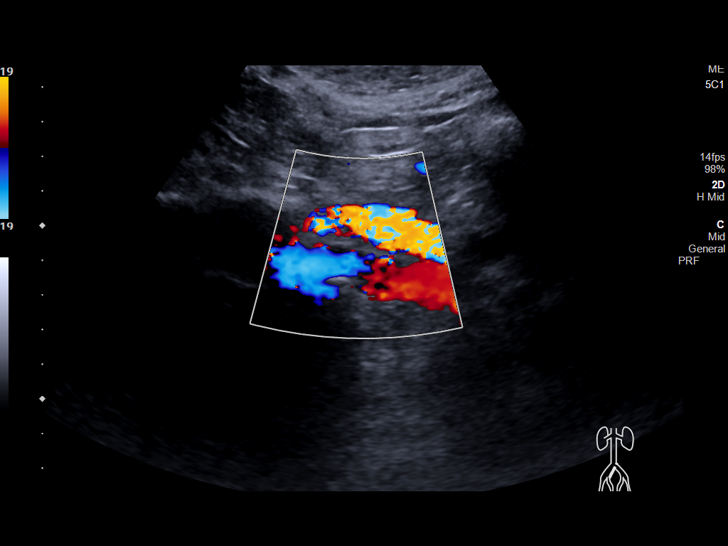
[im 24/24]
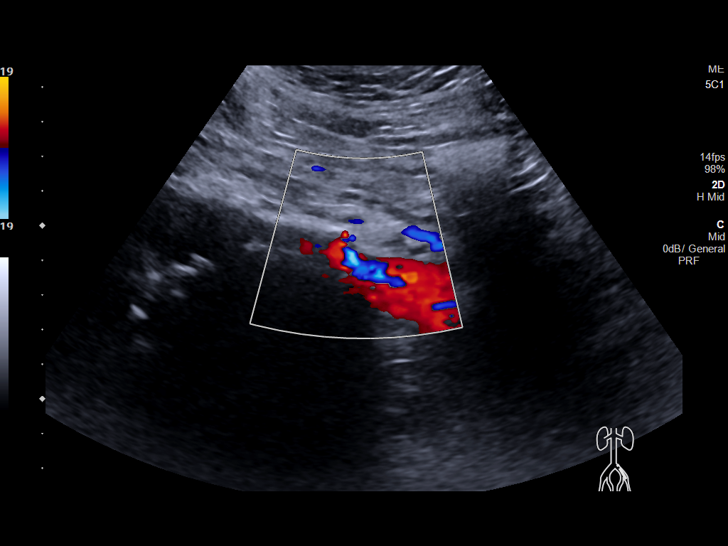

[14 of 24 positions shown; findings below may reference images not displayed]

FINDINGS: Mild aortic atherosclerotic plaque noted.

Abdominal aortic measurements as follows:

Proximal:  3.0 cm

Mid:  2.0 cm

Distal:  3.2 cm
IMPRESSION: Mild distal abdominal aortic aneurysm measuring 3.2 cm in maximum
diameter. Recommend followup by ultrasound in 3 years. This
recommendation follows ACR consensus guidelines: White Paper of the
ACR Incidental Findings Committee II on Vascular Findings. [HOSPITAL] 1032; [DATE]

## 2020-11-10 ENCOUNTER — Encounter: Payer: Self-pay | Admitting: Internal Medicine

## 2020-11-13 ENCOUNTER — Ambulatory Visit: Payer: Medicare PPO | Admitting: Anesthesiology

## 2020-11-13 ENCOUNTER — Encounter
Admission: RE | Disposition: A | Discharge status: Home / Self Care | Payer: Self-pay | Source: Home / Self Care | Attending: Gastroenterology

## 2020-11-13 ENCOUNTER — Ambulatory Visit
Admission: RE | Admit: 2020-11-13 | Discharge: 2020-11-13 | Disposition: A | Discharge status: Home / Self Care | Payer: Medicare PPO | Attending: Gastroenterology | Admitting: Gastroenterology

## 2020-11-13 ENCOUNTER — Encounter: Payer: Self-pay | Admitting: Internal Medicine

## 2020-11-13 ENCOUNTER — Other Ambulatory Visit: Payer: Self-pay

## 2020-11-13 DIAGNOSIS — Z8719 Personal history of other diseases of the digestive system: Secondary | ICD-10-CM | POA: Diagnosis not present

## 2020-11-13 DIAGNOSIS — Z87891 Personal history of nicotine dependence: Secondary | ICD-10-CM | POA: Diagnosis not present

## 2020-11-13 DIAGNOSIS — K5730 Diverticulosis of large intestine without perforation or abscess without bleeding: Secondary | ICD-10-CM | POA: Insufficient documentation

## 2020-11-13 DIAGNOSIS — Z7982 Long term (current) use of aspirin: Secondary | ICD-10-CM | POA: Diagnosis not present

## 2020-11-13 DIAGNOSIS — K621 Rectal polyp: Secondary | ICD-10-CM | POA: Diagnosis not present

## 2020-11-13 DIAGNOSIS — K640 First degree hemorrhoids: Secondary | ICD-10-CM | POA: Insufficient documentation

## 2020-11-13 DIAGNOSIS — Z86010 Personal history of colonic polyps: Secondary | ICD-10-CM | POA: Diagnosis present

## 2020-11-13 DIAGNOSIS — D123 Benign neoplasm of transverse colon: Secondary | ICD-10-CM | POA: Diagnosis not present

## 2020-11-13 DIAGNOSIS — Z79899 Other long term (current) drug therapy: Secondary | ICD-10-CM | POA: Insufficient documentation

## 2020-11-13 DIAGNOSIS — D124 Benign neoplasm of descending colon: Secondary | ICD-10-CM | POA: Insufficient documentation

## 2020-11-13 DIAGNOSIS — Z800 Family history of malignant neoplasm of digestive organs: Secondary | ICD-10-CM | POA: Diagnosis not present

## 2020-11-13 DIAGNOSIS — Z1211 Encounter for screening for malignant neoplasm of colon: Secondary | ICD-10-CM | POA: Insufficient documentation

## 2020-11-13 DIAGNOSIS — K5520 Angiodysplasia of colon without hemorrhage: Secondary | ICD-10-CM | POA: Diagnosis not present

## 2020-11-13 HISTORY — PX: COLONOSCOPY WITH PROPOFOL: SHX5780

## 2020-11-13 SURGERY — COLONOSCOPY WITH PROPOFOL
Anesthesia: General

## 2020-11-13 MED ORDER — PROPOFOL 10 MG/ML IV BOLUS
INTRAVENOUS | Status: AC
  Filled 2020-11-13: qty 20

## 2020-11-13 MED ORDER — EPHEDRINE SULFATE 50 MG/ML IJ SOLN
INTRAMUSCULAR | Status: DC | PRN
  Administered 2020-11-13: 10 mg via INTRAVENOUS

## 2020-11-13 MED ORDER — SODIUM CHLORIDE 0.9 % IV SOLN: INTRAVENOUS | Status: DC

## 2020-11-13 MED ORDER — PROPOFOL 500 MG/50ML IV EMUL
INTRAVENOUS | Status: AC
  Filled 2020-11-13: qty 50

## 2020-11-13 MED ORDER — EPHEDRINE 5 MG/ML INJ
INTRAVENOUS | Status: AC
  Filled 2020-11-13: qty 5

## 2020-11-13 MED ORDER — PROPOFOL 500 MG/50ML IV EMUL
INTRAVENOUS | Status: DC | PRN
  Administered 2020-11-13: 150 ug/kg/min via INTRAVENOUS

## 2020-11-13 NOTE — H&P (Signed)
[facility] Pre-Procedure H&P   Patient ID: John Mueller is a 75 y.o. male.  Gastroenterology Provider: Annamaria Helling, DO  Referring Provider: Laurine Blazer, PA PCP: Dion Body, MD  Date: 11/13/2020  HPI Mr. ADAIN Mueller is a 75 y.o. male who presents today for Colonoscopy for personal history of colon polyps; family history of colon cancer- brother.  Pt has noted gas and bloating. Daily BM with fiber use. No blood or melena.  11/2017- Colonoscopy- with 9 polyps; ranging in size from 2-74mm all TA. Colonoscopy in 2011 demonstrated diverticulosis.  No other acute GI complaints.   Past Medical History:  Diagnosis Date   Arthritis    COPD (chronic obstructive pulmonary disease) ([facility])    GERD (gastroesophageal reflux disease)    Hyperlipidemia    Hypertension     Past Surgical History:  Procedure Laterality Date   CARDIAC CATHETERIZATION     COLONOSCOPY     COLONOSCOPY WITH PROPOFOL N/A 11/14/2017   Procedure: COLONOSCOPY WITH PROPOFOL;  Surgeon: Lollie Sails, MD;  Location: [facility] ENDOSCOPY;  Service: Endoscopy;  Laterality: N/A;   FOOT SURGERY     HERNIA REPAIR     SHOULDER ARTHROSCOPY WITH ROTATOR CUFF REPAIR Right 08/2015    Family History Brother- h/o CRC No other h/o GI disease or malignancy  Review of Systems  Constitutional:  Negative for activity change, appetite change, chills, fatigue, fever and unexpected weight change.  HENT:  Negative for trouble swallowing and voice change.   Respiratory:  Negative for shortness of breath.   Cardiovascular:  Negative for chest pain and palpitations.  Gastrointestinal:  Positive for abdominal distention. Negative for abdominal pain, anal bleeding, blood in stool, constipation, diarrhea, nausea and vomiting.       Gas, bloating  Musculoskeletal:  Negative for arthralgias and myalgias.  Skin:  Negative for color change and pallor.  Neurological:  Negative for dizziness, syncope and  weakness.  Psychiatric/Behavioral:  Negative for confusion. The patient is not nervous/anxious.   All other systems reviewed and are negative.   Medications No current facility-administered medications on file prior to encounter.   Current Outpatient Medications on File Prior to Encounter  Medication Sig Dispense Refill   albuterol (VENTOLIN HFA) 108 (90 Base) MCG/ACT inhaler Inhale 2 puffs into the lungs every 6 (six) hours as needed for wheezing or shortness of breath.     amLODipine (NORVASC) 10 MG tablet Take 10 mg by mouth daily.     aspirin EC 81 MG tablet Take 81 mg by mouth daily.     escitalopram (LEXAPRO) 5 MG tablet Take 5 mg by mouth daily.     fenofibrate 160 MG tablet Take 160 mg by mouth daily.     HM OMEGA-3-6-9 FATTY ACIDS CAPS Take 1,000 mg by mouth daily.     losartan (COZAAR) 100 MG tablet Take 100 mg by mouth daily.     Multiple Vitamin (MULTIVITAMIN) tablet Take 1 tablet by mouth daily.     omeprazole (PRILOSEC) 40 MG capsule Take 40 mg by mouth at bedtime.     pravastatin (PRAVACHOL) 40 MG tablet Take 40 mg by mouth daily.     tiotropium (SPIRIVA) 18 MCG inhalation capsule Place 18 mcg into inhaler and inhale daily.     simvastatin (ZOCOR) 10 MG tablet Take 10 mg by mouth daily. (Patient not taking: Reported on 11/10/2020)      Pertinent medications related to GI and procedure were reviewed by me with the patient prior  to the procedure   Current Facility-Administered Medications:    0.9 %  sodium chloride infusion, , Intravenous, Continuous, Annamaria Helling, DO, Last Rate: 20 mL/hr at 11/13/20 0814, New Bag at 11/13/20 0814      Allergies  Allergen Reactions   Shellfish Allergy Nausea And Vomiting   Allergies were reviewed by me prior to the procedure  Objective    Vitals:   11/10/20 1335 11/13/20 0753 11/13/20 0758  BP:   (!) 141/93  Pulse:   83  Resp:   20  Temp:   97.9 F (36.6 C)  TempSrc:   Temporal  SpO2:   96%  Weight: 91.4 kg 90.7  kg   Height: 5\' 10"  (1.778 m)       Physical Exam Vitals and nursing note reviewed.  Constitutional:      General: He is not in acute distress.    Appearance: Normal appearance. He is not ill-appearing, toxic-appearing or diaphoretic.  HENT:     Head: Normocephalic and atraumatic.     Nose: Nose normal.     Mouth/Throat:     Mouth: Mucous membranes are moist.     Pharynx: Oropharynx is clear.     Comments: Upper and lower dentures Eyes:     General: No scleral icterus.    Extraocular Movements: Extraocular movements intact.  Cardiovascular:     Rate and Rhythm: Normal rate and regular rhythm.     Heart sounds: Normal heart sounds. No murmur heard.   No friction rub. No gallop.  Pulmonary:     Effort: Pulmonary effort is normal. No respiratory distress.     Breath sounds: Normal breath sounds. No wheezing, rhonchi or rales.  Abdominal:     General: Bowel sounds are normal. There is no distension.     Palpations: Abdomen is soft.     Tenderness: There is no abdominal tenderness. There is no guarding or rebound.  Musculoskeletal:     Cervical back: Neck supple.     Right lower leg: No edema.     Left lower leg: No edema.  Skin:    General: Skin is warm and dry.     Coloration: Skin is not jaundiced or pale.  Neurological:     General: No focal deficit present.     Mental Status: He is alert and oriented to person, place, and time. Mental status is at baseline.  Psychiatric:        Mood and Affect: Mood normal.        Behavior: Behavior normal.        Thought Content: Thought content normal.        Judgment: Judgment normal.     Assessment:  Mr. John Mueller is a 76 y.o. male  who presents today for Colonoscopy for personal history of colon polyps; family history of colon cancer.  Plan:  Colonoscopy with possible intervention today  Colonoscopy with possible biopsy, control of bleeding, polypectomy, and interventions as necessary has been discussed with the  patient/patient representative. Informed consent was obtained from the patient/patient representative after explaining the indication, nature, and risks of the procedure including but not limited to death, bleeding, perforation, missed neoplasm/lesions, cardiorespiratory compromise, and reaction to medications. Opportunity for questions was given and appropriate answers were provided. Patient/patient representative has verbalized understanding is amenable to undergoing the procedure.   Annamaria Helling, DO  [facility]  Portions of the record may have been created with voice recognition software. Occasional wrong-word or 'sound-a-like'  substitutions may have occurred due to the inherent limitations of voice recognition software.  Read the chart carefully and recognize, using context, where substitutions may have occurred.

## 2020-11-13 NOTE — Anesthesia Preprocedure Evaluation (Signed)
Anesthesia Evaluation  Patient identified by MRN, date of birth, ID band Patient awake    Reviewed: Allergy & Precautions, H&P , NPO status , Patient's Chart, lab work & pertinent test results, reviewed documented beta blocker date and time   History of Anesthesia Complications Negative for: history of anesthetic complications  Airway Mallampati: II  TM Distance: >3 FB Neck ROM: full    Dental  (+) Dental Advidsory Given, Edentulous Upper, Edentulous Lower, Upper Dentures, Lower Dentures   Pulmonary neg shortness of breath, COPD,  COPD inhaler, neg recent URI, former smoker,    Pulmonary exam normal breath sounds clear to auscultation       Cardiovascular Exercise Tolerance: Good hypertension, (-) angina(-) CAD, (-) Past MI, (-) Cardiac Stents and (-) CABG (-) dysrhythmias (-) Valvular Problems/Murmurs Rhythm:Regular Rate:Normal - Systolic murmurs    Neuro/Psych negative neurological ROS  negative psych ROS   GI/Hepatic Neg liver ROS, GERD  ,  Endo/Other  negative endocrine ROS  Renal/GU negative Renal ROS  negative genitourinary   Musculoskeletal   Abdominal   Peds  Hematology negative hematology ROS (+)   Anesthesia Other Findings Past Medical History: No date: Arthritis No date: COPD (chronic obstructive pulmonary disease) ([facility]) No date: GERD (gastroesophageal reflux disease) No date: Hyperlipidemia No date: Hypertension   Reproductive/Obstetrics negative OB ROS                             Anesthesia Physical  Anesthesia Plan  ASA: 2  Anesthesia Plan: General   Post-op Pain Management:    Induction: Intravenous  PONV Risk Score and Plan: 2 and Propofol infusion and TIVA  Airway Management Planned: Natural Airway and Nasal Cannula  Additional Equipment: None  Intra-op Plan:   Post-operative Plan:   Informed Consent: I have reviewed the patients History and Physical,  chart, labs and discussed the procedure including the risks, benefits and alternatives for the proposed anesthesia with the patient or authorized representative who has indicated his/her understanding and acceptance.     Dental Advisory Given  Plan Discussed with: Anesthesiologist, CRNA and Surgeon  Anesthesia Plan Comments: (Discussed risks of anesthesia with patient, including possibility of difficulty with spontaneous ventilation under anesthesia necessitating airway intervention, PONV, and rare risks such as cardiac or respiratory or neurological events, and allergic reactions. Patient understands.)        Anesthesia Quick Evaluation

## 2020-11-13 NOTE — Transfer of Care (Signed)
Immediate Anesthesia Transfer of Care Note  Patient: John Mueller  Procedure(s) Performed: COLONOSCOPY WITH PROPOFOL  Patient Location: PACU  Anesthesia Type:General  Level of Consciousness: awake and sedated  Airway & Oxygen Therapy: Patient Spontanous Breathing and Patient connected to face mask oxygen  Post-op Assessment: Report given to RN and Post -op Vital signs reviewed and stable  Post vital signs: Reviewed and stable  Last Vitals:  Vitals Value Taken Time  BP 82/59 11/13/20 0906  Temp    Pulse 80 11/13/20 0906  Resp 10 11/13/20 0906  SpO2 92 % 11/13/20 0906  Vitals shown include unvalidated device data.  Last Pain:  Vitals:   11/13/20 0758  TempSrc: Temporal  PainSc: 0-No pain         Complications: No notable events documented.

## 2020-11-13 NOTE — Interval H&P Note (Signed)
History and Physical Interval Note: Preprocedure H&P from 11/13/20  was reviewed and there was no interval change after seeing and examining the patient.  Written consent was obtained from the patient after discussion of risks, benefits, and alternatives. Patient has consented to proceed with Colonoscopy with possible intervention   11/13/2020 8:18 AM  John Mueller  has presented today for surgery, with the diagnosis of Hx of polyps z86.010 Family hx of colon ca z80.0.  The various methods of treatment have been discussed with the patient and family. After consideration of risks, benefits and other options for treatment, the patient has consented to  Procedure(s): COLONOSCOPY WITH PROPOFOL (N/A) as a surgical intervention.  The patient's history has been reviewed, patient examined, no change in status, stable for surgery.  I have reviewed the patient's chart and labs.  Questions were answered to the patient's satisfaction.     Annamaria Helling

## 2020-11-13 NOTE — Anesthesia Procedure Notes (Signed)
Date/Time: 11/13/2020 8:31 AM Performed by: Vaughan Sine Pre-anesthesia Checklist: Patient identified, Emergency Drugs available, Suction available, Patient being monitored and Timeout performed Patient Re-evaluated:Patient Re-evaluated prior to induction Oxygen Delivery Method: Simple face mask Preoxygenation: Pre-oxygenation with 100% oxygen Induction Type: IV induction Placement Confirmation: positive ETCO2 and CO2 detector

## 2020-11-13 NOTE — Anesthesia Postprocedure Evaluation (Signed)
Anesthesia Post Note  Patient: John Mueller  Procedure(s) Performed: COLONOSCOPY WITH PROPOFOL  Patient location during evaluation: Endoscopy Anesthesia Type: General Level of consciousness: awake and alert Pain management: pain level controlled Vital Signs Assessment: post-procedure vital signs reviewed and stable Respiratory status: spontaneous breathing, nonlabored ventilation, respiratory function stable and patient connected to nasal cannula oxygen Cardiovascular status: blood pressure returned to baseline and stable Postop Assessment: no apparent nausea or vomiting Anesthetic complications: no   No notable events documented.   Last Vitals:  Vitals:   11/13/20 0930 11/13/20 0934  BP:  106/75  Pulse: 65 64  Resp: 16 13  Temp:    SpO2: 97% 99%    Last Pain:  Vitals:   11/13/20 0758  TempSrc: Temporal  PainSc: 0-No pain                 Arita Miss

## 2020-11-13 NOTE — Op Note (Signed)
[facility] Patient Name: John Mueller Procedure Date: 11/13/2020 8:24 AM MRN: [id number] Account #: [id number] Date of Birth: 1945/12/19 Admit Type: Outpatient Age: 75 Room: [facility] ENDO ROOM 2 Gender: Male Note Status: Finalized Instrument Name: Colonscope [id number] Procedure:             Colonoscopy Indications:           High risk colon cancer surveillance: Personal history                         of colonic polyps Providers:             Rueben Bash, DO Referring MD:          Dion Body (Referring MD) Medicines:             Monitored Anesthesia Care Complications:         No immediate complications. Estimated blood loss:                         Minimal. Procedure:             Pre-Anesthesia Assessment:                        - Prior to the procedure, a History and Physical was                         performed, and patient medications and allergies were                         reviewed. The patient is competent. The risks and                         benefits of the procedure and the sedation options and                         risks were discussed with the patient. All questions                         were answered and informed consent was obtained.                         Patient identification and proposed procedure were                         verified by the physician, the nurse, the anesthetist                         and the technician in the endoscopy suite. Mental                         Status Examination: alert and oriented. Airway                         Examination: normal oropharyngeal airway and neck                         mobility. Respiratory Examination: clear to  auscultation. CV Examination: RRR, no murmurs, no S3                         or S4. Prophylactic Antibiotics: The patient does not                         require prophylactic antibiotics. Prior                          Anticoagulants: The patient has taken no previous                         anticoagulant or antiplatelet agents. ASA Grade                         Assessment: III - A patient with severe systemic                         disease. After reviewing the risks and benefits, the                         patient was deemed in satisfactory condition to                         undergo the procedure. The anesthesia plan was to use                         monitored anesthesia care (MAC). Immediately prior to                         administration of medications, the patient was                         re-assessed for adequacy to receive sedatives. The                         heart rate, respiratory rate, oxygen saturations,                         blood pressure, adequacy of pulmonary ventilation, and                         response to care were monitored throughout the                         procedure. The physical status of the patient was                         re-assessed after the procedure.                        After obtaining informed consent, the colonoscope was                         passed under direct vision. Throughout the procedure,                         the patient's blood pressure, pulse, and oxygen  saturations were monitored continuously. The                         Colonoscope was introduced through the anus and                         advanced to the the terminal ileum, with                         identification of the appendiceal orifice and IC                         valve. The colonoscopy was performed without                         difficulty. The patient tolerated the procedure well.                         The quality of the bowel preparation was evaluated                         using the BBPS [facility] Bowel Preparation Scale) with                         scores of: Right Colon = 2 (minor amount of residual                         staining, small  fragments of stool and/or opaque                         liquid, but mucosa seen well), Transverse Colon = 3                         (entire mucosa seen well with no residual staining,                         small fragments of stool or opaque liquid) and Left                         Colon = 3 (entire mucosa seen well with no residual                         staining, small fragments of stool or opaque liquid).                         The total BBPS score equals 8. The quality of the                         bowel preparation was excellent. The ileocecal valve,                         appendiceal orifice, and rectum were photographed. Findings:      The perianal and digital rectal examinations were normal. Pertinent       negatives include normal sphincter tone.      The terminal ileum appeared normal.      A 10 to 12 mm polyp was found in the descending  colon. The polyp was       semi-pedunculated. The polyp was removed with a hot snare. Resection and       retrieval were complete. Estimated blood loss was minimal.      A 7 to 8 mm polyp was found in the transverse colon. The polyp was       sessile. The polyp was removed with a cold snare. Resection and       retrieval were complete. Estimated blood loss was minimal.      Three sessile polyps were found in the descending colon. The polyps were       2 to 3 mm in size. These polyps were removed with a cold biopsy forceps.       Resection and retrieval were complete. Estimated blood loss was minimal.      A 2 to 3 mm polyp was found in the rectum. The polyp was sessile. The       polyp was removed with a cold biopsy forceps. Resection and retrieval       were complete. Estimated blood loss was minimal.      Non-bleeding internal hemorrhoids were found during retroflexion. The       hemorrhoids were Grade I (internal hemorrhoids that do not prolapse).       Estimated blood loss: none.      Many small and large-mouthed diverticula were found  in the left colon.       Estimated blood loss: none.      Two small localized angioectasias without bleeding were found in the       cecum. Estimated blood loss: none.      The exam was otherwise without abnormality on direct and retroflexion       views. Impression:            - The examined portion of the ileum was normal.                        - One 10 to 12 mm polyp in the descending colon,                         removed with a hot snare. Resected and retrieved.                        - One 7 to 8 mm polyp in the transverse colon, removed                         with a cold snare. Resected and retrieved.                        - Three 2 to 3 mm polyps in the descending colon,                         removed with a cold biopsy forceps. Resected and                         retrieved.                        - One 2 to 3 mm polyp in the rectum, removed with a  cold biopsy forceps. Resected and retrieved.                        - Non-bleeding internal hemorrhoids.                        - Diverticulosis in the left colon.                        - Two non-bleeding colonic angioectasias.                        - The examination was otherwise normal on direct and                         retroflexion views. Recommendation:        - Resume previous diet.                        - Continue present medications.                        - No aspirin, ibuprofen, naproxen, or other                         non-steroidal anti-inflammatory drugs for 3 days after                         polyp removal.                        - Await pathology results.                        - Repeat colonoscopy for surveillance based on                         pathology results.                        - Return to referring physician as previously                         scheduled. Procedure Code(s):     --- Professional ---                        [phone], Colonoscopy, flexible; with removal of                          tumor(s), polyp(s), or other lesion(s) by snare                         technique                        45380, 4, Colonoscopy, flexible; with biopsy, single                         or multiple Diagnosis Code(s):     --- Professional ---                        Z86.010, Personal history of colonic  polyps                        K64.0, First degree hemorrhoids                        K63.5, Polyp of colon                        K62.1, Rectal polyp                        K55.20, Angiodysplasia of colon without hemorrhage                        K57.30, Diverticulosis of large intestine without                         perforation or abscess without bleeding CPT copyright 2019 American Medical Association. All rights reserved. The codes documented in this report are preliminary and upon coder review may  be revised to meet current compliance requirements. Attending Participation:      I personally performed the entire procedure. Volney American, DO Annamaria Helling DO, DO 11/13/2020 9:10:42 AM This report has been signed electronically. Number of Addenda: 0 Note Initiated On: 11/13/2020 8:24 AM Scope Withdrawal Time: 0 hours 17 minutes 27 seconds  Total Procedure Duration: 0 hours 30 minutes 50 seconds  Estimated Blood Loss:  Estimated blood loss was minimal.      [facility]

## 2020-11-14 ENCOUNTER — Encounter: Payer: Self-pay | Admitting: Gastroenterology

## 2020-11-14 LAB — SURGICAL PATHOLOGY

## 2022-01-31 ENCOUNTER — Encounter: Payer: Self-pay | Admitting: Ophthalmology

## 2022-02-07 NOTE — Discharge Instructions (Signed)

## 2022-02-13 ENCOUNTER — Ambulatory Visit: Payer: Medicare PPO | Admitting: Anesthesiology

## 2022-02-13 ENCOUNTER — Ambulatory Visit
Admission: RE | Admit: 2022-02-13 | Discharge: 2022-02-13 | Disposition: A | Discharge status: Home / Self Care | Payer: Medicare PPO | Attending: Ophthalmology | Admitting: Ophthalmology

## 2022-02-13 ENCOUNTER — Encounter
Admission: RE | Disposition: A | Discharge status: Home / Self Care | Payer: Self-pay | Source: Home / Self Care | Attending: Ophthalmology

## 2022-02-13 ENCOUNTER — Other Ambulatory Visit: Payer: Self-pay

## 2022-02-13 DIAGNOSIS — Z87891 Personal history of nicotine dependence: Secondary | ICD-10-CM | POA: Insufficient documentation

## 2022-02-13 DIAGNOSIS — H2512 Age-related nuclear cataract, left eye: Secondary | ICD-10-CM | POA: Insufficient documentation

## 2022-02-13 DIAGNOSIS — I10 Essential (primary) hypertension: Secondary | ICD-10-CM | POA: Diagnosis not present

## 2022-02-13 DIAGNOSIS — E785 Hyperlipidemia, unspecified: Secondary | ICD-10-CM | POA: Diagnosis not present

## 2022-02-13 DIAGNOSIS — K219 Gastro-esophageal reflux disease without esophagitis: Secondary | ICD-10-CM | POA: Diagnosis not present

## 2022-02-13 DIAGNOSIS — Z79899 Other long term (current) drug therapy: Secondary | ICD-10-CM | POA: Insufficient documentation

## 2022-02-13 DIAGNOSIS — J449 Chronic obstructive pulmonary disease, unspecified: Secondary | ICD-10-CM | POA: Diagnosis not present

## 2022-02-13 DIAGNOSIS — E1136 Type 2 diabetes mellitus with diabetic cataract: Secondary | ICD-10-CM | POA: Insufficient documentation

## 2022-02-13 HISTORY — PX: CATARACT EXTRACTION W/PHACO: SHX586

## 2022-02-13 HISTORY — DX: Dizziness and giddiness: R42

## 2022-02-13 HISTORY — DX: Presence of dental prosthetic device (complete) (partial): Z97.2

## 2022-02-13 HISTORY — DX: Presence of external hearing-aid: Z97.4

## 2022-02-13 SURGERY — PHACOEMULSIFICATION, CATARACT, WITH IOL INSERTION
Anesthesia: Monitor Anesthesia Care | Site: Eye | Laterality: Left

## 2022-02-13 MED ORDER — CEFUROXIME OPHTHALMIC INJECTION 1 MG/0.1 ML
INJECTION | OPHTHALMIC | Status: DC | PRN
  Administered 2022-02-13: .1 mL via INTRACAMERAL

## 2022-02-13 MED ORDER — TETRACAINE HCL 0.5 % OP SOLN
1.0000 [drp] | OPHTHALMIC | Status: DC | PRN
  Administered 2022-02-13 (×3): 1 [drp] via OPHTHALMIC

## 2022-02-13 MED ORDER — DEXMEDETOMIDINE HCL IN NACL 80 MCG/20ML IV SOLN
INTRAVENOUS | Status: DC | PRN
  Administered 2022-02-13: 4 ug via BUCCAL

## 2022-02-13 MED ORDER — MIDAZOLAM HCL 2 MG/2ML IJ SOLN
INTRAMUSCULAR | Status: DC | PRN
  Administered 2022-02-13: 1.5 mg via INTRAVENOUS

## 2022-02-13 MED ORDER — SIGHTPATH DOSE#1 BSS IO SOLN
INTRAOCULAR | Status: DC | PRN
  Administered 2022-02-13: 15 mL

## 2022-02-13 MED ORDER — ARMC OPHTHALMIC DILATING DROPS
1.0000 | OPHTHALMIC | Status: DC | PRN
  Administered 2022-02-13 (×3): 1 via OPHTHALMIC

## 2022-02-13 MED ORDER — LACTATED RINGERS IV SOLN: INTRAVENOUS | Status: DC

## 2022-02-13 MED ORDER — SIGHTPATH DOSE#1 BSS IO SOLN
INTRAOCULAR | Status: DC | PRN
  Administered 2022-02-13: 91 mL via OPHTHALMIC

## 2022-02-13 MED ORDER — SODIUM CHLORIDE 0.9% FLUSH
INTRAVENOUS | Status: DC | PRN
  Administered 2022-02-13: 10 mL via INTRAVENOUS

## 2022-02-13 MED ORDER — BRIMONIDINE TARTRATE-TIMOLOL 0.2-0.5 % OP SOLN
OPHTHALMIC | Status: DC | PRN
  Administered 2022-02-13: 1 [drp] via OPHTHALMIC

## 2022-02-13 MED ORDER — SIGHTPATH DOSE#1 BSS IO SOLN
INTRAOCULAR | Status: DC | PRN
  Administered 2022-02-13: 1 mL via INTRAMUSCULAR

## 2022-02-13 MED ORDER — SIGHTPATH DOSE#1 NA HYALUR & NA CHOND-NA HYALUR IO KIT
PACK | INTRAOCULAR | Status: DC | PRN
  Administered 2022-02-13: 1 via OPHTHALMIC

## 2022-02-13 SURGICAL SUPPLY — 10 items
CATARACT SUITE SIGHTPATH (MISCELLANEOUS) ×1 IMPLANT
FEE CATARACT SUITE SIGHTPATH (MISCELLANEOUS) ×1 IMPLANT
GLOVE SRG 8 PF TXTR STRL LF DI (GLOVE) ×1 IMPLANT
GLOVE SURG ENC TEXT LTX SZ7.5 (GLOVE) ×1 IMPLANT
GLOVE SURG UNDER POLY LF SZ8 (GLOVE) ×1
LENS IOL TECNIS EYHANCE 20.0 (Intraocular Lens) IMPLANT
NDL FILTER BLUNT 18X1 1/2 (NEEDLE) ×1 IMPLANT
NEEDLE FILTER BLUNT 18X1 1/2 (NEEDLE) ×1 IMPLANT
SYR 3ML LL SCALE MARK (SYRINGE) ×1 IMPLANT
WATER STERILE IRR 250ML POUR (IV SOLUTION) ×1 IMPLANT

## 2022-02-13 NOTE — Anesthesia Postprocedure Evaluation (Signed)
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   Last Vitals:  Vitals:   02/13/22 0757 02/13/22 0803  BP: (!) 129/91   Pulse: 66   Resp: 17   Temp: 36.5 C 36.6 C  SpO2: 96%     Last Pain:  Vitals:   02/13/22 0803  TempSrc:   PainSc: 0-No pain                 Dimas Millin

## 2022-02-13 NOTE — Op Note (Signed)
OPERATIVE NOTE  John Mueller [id number] 02/13/2022   PREOPERATIVE DIAGNOSIS:  Nuclear sclerotic cataract left eye. H25.12   POSTOPERATIVE DIAGNOSIS:    Nuclear sclerotic cataract left eye.     PROCEDURE:  Phacoemusification with posterior chamber intraocular lens placement of the left eye  Ultrasound time: Procedure(s): CATARACT EXTRACTION PHACO AND INTRAOCULAR LENS PLACEMENT (IOC) Left Diabetes  13.52  01:32.1 (Left)  LENS:   Implant Name Type Inv. Item Serial No. Manufacturer Lot No. LRB No. Used Action  LENS IOL TECNIS EYHANCE 20.0 - [id number] Intraocular Lens LENS IOL TECNIS EYHANCE 20.0 [id number] [employer]  Left 1 Implanted      SURGEON:  Wyonia Hough, MD   ANESTHESIA:  Topical with tetracaine drops and 2% Xylocaine jelly, augmented with 1% preservative-free intracameral lidocaine.    COMPLICATIONS:  None.   DESCRIPTION OF PROCEDURE:  The patient was identified in the holding room and transported to the operating room and placed in the supine position under the operating microscope.  The left eye was identified as the operative eye and it was prepped and draped in the usual sterile ophthalmic fashion.   A 1 millimeter clear-corneal paracentesis was made at the 1:30 position.  0.5 ml of preservative-free 1% lidocaine was injected into the anterior chamber.  The anterior chamber was filled with Viscoat viscoelastic.  A 2.4 millimeter keratome was used to make a near-clear corneal incision at the 10:30 position.  .  A curvilinear capsulorrhexis was made with a cystotome and capsulorrhexis forceps.  Balanced salt solution was used to hydrodissect and hydrodelineate the nucleus.   Phacoemulsification was then used in stop and chop fashion to remove the lens nucleus and epinucleus.  The remaining cortex was then removed using the irrigation and aspiration handpiece. Provisc was then placed into the capsular bag to distend it for lens placement.  A lens was then injected into  the capsular bag.  The remaining viscoelastic was aspirated.   Wounds were hydrated with balanced salt solution.  The anterior chamber was inflated to a physiologic pressure with balanced salt solution.  No wound leaks were noted. Cefuroxime 0.1 ml of a '10mg'$ /ml solution was injected into the anterior chamber for a dose of 1 mg of intracameral antibiotic at the completion of the case.   Timolol and Brimonidine drops were applied to the eye.  The patient was taken to the recovery room in stable condition without complications of anesthesia or surgery.  [name] 02/13/2022, 7:57 AM

## 2022-02-13 NOTE — Anesthesia Preprocedure Evaluation (Addendum)
Anesthesia Evaluation  Patient identified by MRN, date of birth, ID band Patient awake    Reviewed: Allergy & Precautions, H&P , NPO status , Patient's Chart, lab work & pertinent test results, reviewed documented beta blocker date and time   History of Anesthesia Complications Negative for: history of anesthetic complications  Airway Mallampati: II  TM Distance: >3 FB Neck ROM: full    Dental  (+) Dental Advidsory Given, Edentulous Upper, Edentulous Lower, Upper Dentures, Lower Dentures   Pulmonary neg shortness of breath, COPD,  COPD inhaler, neg recent URI, former smoker   Pulmonary exam normal breath sounds clear to auscultation       Cardiovascular Exercise Tolerance: Good hypertension, (-) angina (-) CAD, (-) Past MI, (-) Cardiac Stents and (-) CABG Normal cardiovascular exam(-) dysrhythmias (-) Valvular Problems/Murmurs Rhythm:Regular Rate:Normal - Systolic murmurs    Neuro/Psych negative neurological ROS  negative psych ROS   GI/Hepatic negative GI ROS, Neg liver ROS,GERD  ,,  Endo/Other  negative endocrine ROS    Renal/GU negative Renal ROS  negative genitourinary   Musculoskeletal   Abdominal   Peds  Hematology negative hematology ROS (+)   Anesthesia Other Findings Past Medical History: No date: Arthritis No date: COPD (chronic obstructive pulmonary disease) (HCC) No date: GERD (gastroesophageal reflux disease) No date: Hyperlipidemia No date: Hypertension   Reproductive/Obstetrics negative OB ROS                              Anesthesia Physical Anesthesia Plan  ASA: 3  Anesthesia Plan: MAC   Post-op Pain Management:    Induction: Intravenous  PONV Risk Score and Plan: 2 and TIVA and Midazolam  Airway Management Planned: Natural Airway and Nasal Cannula  Additional Equipment: None  Intra-op Plan:   Post-operative Plan:   Informed Consent: I have reviewed the  patients History and Physical, chart, labs and discussed the procedure including the risks, benefits and alternatives for the proposed anesthesia with the patient or authorized representative who has indicated his/her understanding and acceptance.     Dental Advisory Given  Plan Discussed with: Anesthesiologist, CRNA and Surgeon  Anesthesia Plan Comments: (Discussed risks of anesthesia with patient, including possibility of difficulty with spontaneous ventilation under anesthesia necessitating airway intervention, PONV, and rare risks such as cardiac or respiratory or neurological events, and allergic reactions. Patient understands.)         Anesthesia Quick Evaluation

## 2022-02-13 NOTE — Transfer of Care (Signed)
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

## 2022-02-13 NOTE — H&P (Signed)
[facility]   Primary Care Physician:  Dion Body, MD Ophthalmologist: Dr. Leandrew Koyanagi  Pre-Procedure History & Physical: HPI:  John Mueller is a 77 y.o. male here for ophthalmic surgery.   Past Medical History:  Diagnosis Date   Arthritis    COPD (chronic obstructive pulmonary disease) (HCC)    GERD (gastroesophageal reflux disease)    Hyperlipidemia    Hypertension    Vertigo    about 1x/year   Wears dentures    full upper and lower   Wears hearing aid in right ear     Past Surgical History:  Procedure Laterality Date   CARDIAC CATHETERIZATION     COLONOSCOPY     COLONOSCOPY WITH PROPOFOL N/A 11/14/2017   Procedure: COLONOSCOPY WITH PROPOFOL;  Surgeon: Lollie Sails, MD;  Location: [facility] ENDOSCOPY;  Service: Endoscopy;  Laterality: N/A;   COLONOSCOPY WITH PROPOFOL N/A 11/13/2020   Procedure: COLONOSCOPY WITH PROPOFOL;  Surgeon: Annamaria Helling, DO;  Location: [facility] ENDOSCOPY;  Service: Gastroenterology;  Laterality: N/A;   FOOT SURGERY     HERNIA REPAIR     SHOULDER ARTHROSCOPY WITH ROTATOR CUFF REPAIR Right 08/2015    Prior to Admission medications   Medication Sig Start Date End Date Taking? Authorizing Provider  albuterol (VENTOLIN HFA) 108 (90 Base) MCG/ACT inhaler Inhale 2 puffs into the lungs every 6 (six) hours as needed for wheezing or shortness of breath.   Yes [provider]  ascorbic acid (VITAMIN C) 500 MG tablet Take 500 mg by mouth daily.   Yes [provider]  aspirin EC 81 MG tablet Take 81 mg by mouth daily.   Yes [provider]  CINNAMON PO Take 1,000 mg by mouth daily.   Yes [provider]  cyanocobalamin (VITAMIN B12) 1000 MCG tablet Take 1,000 mcg by mouth daily.   Yes [provider]  escitalopram (LEXAPRO) 5 MG tablet Take 5 mg by mouth daily.   Yes [provider]  fenofibrate 160 MG tablet Take 160 mg by mouth daily.   Yes [provider]  HM  OMEGA-3-6-9 FATTY ACIDS CAPS Take 1,000 mg by mouth daily.   Yes [provider]  losartan (COZAAR) 100 MG tablet Take 100 mg by mouth daily.   Yes [provider]  Multiple Vitamin (MULTIVITAMIN) tablet Take 1 tablet by mouth daily.   Yes [provider]  omeprazole (PRILOSEC) 40 MG capsule Take 40 mg by mouth daily as needed.   Yes [provider]  pravastatin (PRAVACHOL) 40 MG tablet Take 40 mg by mouth daily.   Yes [provider]  tiotropium (SPIRIVA) 18 MCG inhalation capsule Place 18 mcg into inhaler and inhale daily.   Yes [provider]  amLODipine (NORVASC) 10 MG tablet Take 10 mg by mouth daily.    [provider]    Allergies as of 01/10/2022 - Review Complete 11/13/2020  Allergen Reaction Noted   Shellfish allergy Nausea And Vomiting 11/13/2017    History reviewed. No pertinent family history.  Social History   Socioeconomic History   Marital status: Married    Spouse name: Not on file   Number of children: Not on file   Years of education: Not on file   Highest education level: Not on file  Occupational History   Not on file  Tobacco Use   Smoking status: Former    Packs/day: 1.00    Years: 32.00    Total pack years: 32.00  Types: Cigarettes    Quit date: 02/11/1997    Years since quitting: 25.0   Smokeless tobacco: Never  Vaping Use   Vaping Use: Never used  Substance and Sexual Activity   Alcohol use: Yes    Alcohol/week: 14.0 standard drinks of alcohol    Types: 14 Cans of beer per week    Comment: 2 beers/day   Drug use: Never   Sexual activity: Not on file  Other Topics Concern   Not on file  Social History Narrative   Not on file   Social Determinants of Health   Financial Resource Strain: Not on file  Food Insecurity: Not on file  Transportation Needs: Not on file  Physical Activity: Not on file  Stress: Not on file  Social Connections: Not on file  Intimate Partner Violence:  Not on file    Review of Systems: See HPI, otherwise negative ROS  Physical Exam: BP (!) 178/83   Pulse 74   Temp (!) 97.1 F (36.2 C) (Temporal)   Resp 14   Ht '5\' 10"'$  (1.778 m)   Wt 88 kg   SpO2 95%   BMI 27.84 kg/m  General:   Alert,  pleasant and cooperative in NAD Head:  Normocephalic and atraumatic. Lungs:  Clear to auscultation.    Heart:  Regular rate and rhythm.   Impression/Plan: John Mueller is here for ophthalmic surgery.  Risks, benefits, limitations, and alternatives regarding ophthalmic surgery have been reviewed with the patient.  Questions have been answered.  All parties agreeable.   Leandrew Koyanagi, MD  02/13/2022, 7:30 AM

## 2022-02-14 ENCOUNTER — Encounter: Payer: Self-pay | Admitting: Ophthalmology

## 2022-02-25 NOTE — Discharge Instructions (Signed)
   Cataract Surgery, Care After ? ?This sheet gives you information about how to care for yourself after your surgery.  Your ophthalmologist may also give you more specific instructions.  If you have problems or questions, contact your doctor at [facility], [phone]. ? ?What can I expect after the surgery? ?It is common to have: ?Itching ?Foreign body sensation (feels like a grain of sand in the eye) ?Watery discharge (excess tearing) ?Sensitivity to light and touch ?Bruising in or around the eye ?Mild blurred vision ? ?Follow these instructions at home: ?Do not touch or rub your eyes. ?You may be told to wear a protective shield or sunglasses to protect your eyes. ?Do not put a contact lens in the operative eye unless your doctor approves. ?Keep the lids and face clean and dry. ?Do not allow water to hit you directly in the face while showering. ?Keep soap and shampoo out of your eyes. ?Do not use eye makeup for 1 week. ? ?Check your eye every day for signs of infection.  Watch for: ?Redness, swelling, or pain. ?Fluid, blood or pus. ?Worsening vision. ?Worsening sensitivity to light or touch. ? ?Activity: ?During the first day, avoid bending over and reading.  You may resume reading and bending the next day. ?Do not drive or use heavy machinery for at least 24 hours. ?Avoid strenuous activities for 1 week.  Activities such as walking, treadmill, exercise bike, and climbing stairs are okay. ?Do not lift heavy (>20 pound) objects for 1 week. ?Do not do yardwork, gardening, or dirty housework (mopping, cleaning bathrooms, vacuuming, etc.) for 1 week. ?Do not swim or use a hot tub for 2 weeks. ?Ask your doctor when you can return to work. ? ?General Instructions: ?Take or apply prescription and over-the-counter medicines as directed by your doctor, including eyedrops and ointments. ?Resume medications discontinued prior to surgery, unless told otherwise by your doctor. ?Keep all follow up appointments as  scheduled. ? ?Contact a health care provider if: ?You have increased bruising around your eye. ?You have pain that is not helped with medication. ?You have a fever. ?You have fluid, pus, or blood coming from your eye or incision. ?Your sensitivity to light gets worse. ?You have spots (floaters) of flashing lights in your vision. ?You have nausea or vomiting. ? ?Go to the nearest emergency room or call 911 if: ?You have sudden loss of vision. ?You have severe, worsening eye pain. ? ?

## 2022-02-27 ENCOUNTER — Other Ambulatory Visit: Payer: Self-pay

## 2022-02-27 ENCOUNTER — Ambulatory Visit: Payer: Medicare PPO | Admitting: Anesthesiology

## 2022-02-27 ENCOUNTER — Ambulatory Visit
Admission: RE | Admit: 2022-02-27 | Discharge: 2022-02-27 | Disposition: A | Discharge status: Home / Self Care | Payer: Medicare PPO | Attending: Ophthalmology | Admitting: Ophthalmology

## 2022-02-27 ENCOUNTER — Encounter
Admission: RE | Disposition: A | Discharge status: Home / Self Care | Payer: Self-pay | Source: Home / Self Care | Attending: Ophthalmology

## 2022-02-27 DIAGNOSIS — Z9842 Cataract extraction status, left eye: Secondary | ICD-10-CM | POA: Insufficient documentation

## 2022-02-27 DIAGNOSIS — E785 Hyperlipidemia, unspecified: Secondary | ICD-10-CM | POA: Diagnosis not present

## 2022-02-27 DIAGNOSIS — H2511 Age-related nuclear cataract, right eye: Secondary | ICD-10-CM | POA: Insufficient documentation

## 2022-02-27 DIAGNOSIS — Z961 Presence of intraocular lens: Secondary | ICD-10-CM | POA: Insufficient documentation

## 2022-02-27 DIAGNOSIS — I10 Essential (primary) hypertension: Secondary | ICD-10-CM | POA: Diagnosis not present

## 2022-02-27 DIAGNOSIS — Z87891 Personal history of nicotine dependence: Secondary | ICD-10-CM | POA: Insufficient documentation

## 2022-02-27 DIAGNOSIS — J449 Chronic obstructive pulmonary disease, unspecified: Secondary | ICD-10-CM | POA: Insufficient documentation

## 2022-02-27 DIAGNOSIS — K219 Gastro-esophageal reflux disease without esophagitis: Secondary | ICD-10-CM | POA: Diagnosis not present

## 2022-02-27 HISTORY — PX: CATARACT EXTRACTION W/PHACO: SHX586

## 2022-02-27 SURGERY — PHACOEMULSIFICATION, CATARACT, WITH IOL INSERTION
Anesthesia: Monitor Anesthesia Care | Site: Eye | Laterality: Right

## 2022-02-27 MED ORDER — TETRACAINE HCL 0.5 % OP SOLN
1.0000 [drp] | OPHTHALMIC | Status: DC | PRN
  Administered 2022-02-27 (×3): 1 [drp] via OPHTHALMIC

## 2022-02-27 MED ORDER — FENTANYL CITRATE (PF) 100 MCG/2ML IJ SOLN
INTRAMUSCULAR | Status: DC | PRN
  Administered 2022-02-27: 50 ug via INTRAVENOUS

## 2022-02-27 MED ORDER — SIGHTPATH DOSE#1 BSS IO SOLN
INTRAOCULAR | Status: DC | PRN
  Administered 2022-02-27: 88 mL via OPHTHALMIC

## 2022-02-27 MED ORDER — SIGHTPATH DOSE#1 BSS IO SOLN
INTRAOCULAR | Status: DC | PRN
  Administered 2022-02-27: 15 mL

## 2022-02-27 MED ORDER — CEFUROXIME OPHTHALMIC INJECTION 1 MG/0.1 ML
INJECTION | OPHTHALMIC | Status: DC | PRN
  Administered 2022-02-27: .1 mL via INTRACAMERAL

## 2022-02-27 MED ORDER — SIGHTPATH DOSE#1 NA HYALUR & NA CHOND-NA HYALUR IO KIT
PACK | INTRAOCULAR | Status: DC | PRN
  Administered 2022-02-27: 1 via OPHTHALMIC

## 2022-02-27 MED ORDER — ARMC OPHTHALMIC DILATING DROPS
1.0000 | OPHTHALMIC | Status: DC | PRN
  Administered 2022-02-27 (×3): 1 via OPHTHALMIC

## 2022-02-27 MED ORDER — LACTATED RINGERS IV SOLN: INTRAVENOUS | Status: DC

## 2022-02-27 MED ORDER — SIGHTPATH DOSE#1 BSS IO SOLN
INTRAOCULAR | Status: DC | PRN
  Administered 2022-02-27: 1 mL

## 2022-02-27 MED ORDER — NEOMYCIN-POLYMYXIN-DEXAMETH 3.5-10000-0.1 OP OINT
TOPICAL_OINTMENT | OPHTHALMIC | Status: DC | PRN
  Administered 2022-02-27: 1 via OPHTHALMIC

## 2022-02-27 MED ORDER — MIDAZOLAM HCL 2 MG/2ML IJ SOLN
INTRAMUSCULAR | Status: DC | PRN
  Administered 2022-02-27: 1 mg via INTRAVENOUS

## 2022-02-27 MED ORDER — BRIMONIDINE TARTRATE-TIMOLOL 0.2-0.5 % OP SOLN
OPHTHALMIC | Status: DC | PRN
  Administered 2022-02-27: 1 [drp] via OPHTHALMIC

## 2022-02-27 SURGICAL SUPPLY — 20 items
CANNULA ANT/CHMB 27G (MISCELLANEOUS) IMPLANT
CANNULA ANT/CHMB 27GA (MISCELLANEOUS) IMPLANT
CATARACT SUITE SIGHTPATH (MISCELLANEOUS) ×1 IMPLANT
FEE CATARACT SUITE SIGHTPATH (MISCELLANEOUS) ×1 IMPLANT
GLOVE SRG 8 PF TXTR STRL LF DI (GLOVE) ×1 IMPLANT
GLOVE SURG ENC TEXT LTX SZ7.5 (GLOVE) ×1 IMPLANT
GLOVE SURG GAMMEX PI TX LF 7.5 (GLOVE) IMPLANT
GLOVE SURG UNDER POLY LF SZ8 (GLOVE) ×1
LENS IOL TECNIS EYHANCE 20.0 (Intraocular Lens) IMPLANT
NDL FILTER BLUNT 18X1 1/2 (NEEDLE) ×1 IMPLANT
NDL RETROBULBAR .5 NSTRL (NEEDLE) IMPLANT
NEEDLE FILTER BLUNT 18X1 1/2 (NEEDLE) ×1 IMPLANT
PACK VIT ANT 23G (MISCELLANEOUS) IMPLANT
RING MALYGIN 7.0 (MISCELLANEOUS) IMPLANT
SUT ETHILON 10-0 CS-B-6CS-B-6 (SUTURE)
SUT VICRYL  9 0 (SUTURE)
SUT VICRYL 9 0 (SUTURE) IMPLANT
SUTURE EHLN 10-0 CS-B-6CS-B-6 (SUTURE) IMPLANT
SYR 3ML LL SCALE MARK (SYRINGE) ×1 IMPLANT
WATER STERILE IRR 250ML POUR (IV SOLUTION) ×1 IMPLANT

## 2022-02-27 NOTE — H&P (Signed)
[facility]   Primary Care Physician:  Dion Body, MD Ophthalmologist: Dr. Leandrew Koyanagi  Pre-Procedure History & Physical: HPI:  John Mueller is a 77 y.o. male here for ophthalmic surgery.   Past Medical History:  Diagnosis Date   Arthritis    COPD (chronic obstructive pulmonary disease) (HCC)    GERD (gastroesophageal reflux disease)    Hyperlipidemia    Hypertension    Vertigo    about 1x/year   Wears dentures    full upper and lower   Wears hearing aid in right ear     Past Surgical History:  Procedure Laterality Date   CARDIAC CATHETERIZATION     CATARACT EXTRACTION W/PHACO Left 02/13/2022   Procedure: CATARACT EXTRACTION PHACO AND INTRAOCULAR LENS PLACEMENT ([place]) Left Diabetes  13.52  01:32.1;  Surgeon: Leandrew Koyanagi, MD;  Location: [place];  Service: Ophthalmology;  Laterality: Left;   COLONOSCOPY     COLONOSCOPY WITH PROPOFOL N/A 11/14/2017   Procedure: COLONOSCOPY WITH PROPOFOL;  Surgeon: Lollie Sails, MD;  Location: [facility] ENDOSCOPY;  Service: Endoscopy;  Laterality: N/A;   COLONOSCOPY WITH PROPOFOL N/A 11/13/2020   Procedure: COLONOSCOPY WITH PROPOFOL;  Surgeon: Annamaria Helling, DO;  Location: [facility] ENDOSCOPY;  Service: Gastroenterology;  Laterality: N/A;   FOOT SURGERY     HERNIA REPAIR     SHOULDER ARTHROSCOPY WITH ROTATOR CUFF REPAIR Right 08/2015    Prior to Admission medications   Medication Sig Start Date End Date Taking? Authorizing Provider  albuterol (VENTOLIN HFA) 108 (90 Base) MCG/ACT inhaler Inhale 2 puffs into the lungs every 6 (six) hours as needed for wheezing or shortness of breath.   Yes [provider]  amLODipine (NORVASC) 10 MG tablet Take 10 mg by mouth daily.   Yes [provider]  ascorbic acid (VITAMIN C) 500 MG tablet Take 500 mg by mouth daily.   Yes [provider]  aspirin EC 81 MG tablet Take 81 mg by mouth daily.   Yes [provider]  CINNAMON PO  Take 1,000 mg by mouth daily.   Yes [provider]  cyanocobalamin (VITAMIN B12) 1000 MCG tablet Take 1,000 mcg by mouth daily.   Yes [provider]  escitalopram (LEXAPRO) 5 MG tablet Take 5 mg by mouth daily.   Yes [provider]  fenofibrate 160 MG tablet Take 160 mg by mouth daily.   Yes [provider]  HM OMEGA-3-6-9 FATTY ACIDS CAPS Take 1,000 mg by mouth daily.   Yes [provider]  losartan (COZAAR) 100 MG tablet Take 100 mg by mouth daily.   Yes [provider]  Multiple Vitamin (MULTIVITAMIN) tablet Take 1 tablet by mouth daily.   Yes [provider]  omeprazole (PRILOSEC) 40 MG capsule Take 40 mg by mouth daily as needed.   Yes [provider]  pravastatin (PRAVACHOL) 40 MG tablet Take 40 mg by mouth daily.   Yes [provider]  tiotropium (SPIRIVA) 18 MCG inhalation capsule Place 18 mcg into inhaler and inhale daily.   Yes [provider]    Allergies as of 01/10/2022 - Review Complete 11/13/2020  Allergen Reaction Noted   Shellfish allergy Nausea And Vomiting 11/13/2017    History reviewed. No pertinent family history.  Social History   Socioeconomic History   Marital status: Married    Spouse name: Not on file   Number of children: Not on file   Years of education: Not on file   Highest  education level: Not on file  Occupational History   Not on file  Tobacco Use   Smoking status: Former    Packs/day: 1.00    Years: 32.00    Total pack years: 32.00    Types: Cigarettes    Quit date: 02/11/1997    Years since quitting: 25.0   Smokeless tobacco: Never  Vaping Use   Vaping Use: Never used  Substance and Sexual Activity   Alcohol use: Yes    Alcohol/week: 14.0 standard drinks of alcohol    Types: 14 Cans of beer per week    Comment: 2 beers/day   Drug use: Never   Sexual activity: Not on file  Other Topics Concern   Not on file  Social History Narrative   Not on  file   Social Determinants of Health   Financial Resource Strain: Not on file  Food Insecurity: Not on file  Transportation Needs: Not on file  Physical Activity: Not on file  Stress: Not on file  Social Connections: Not on file  Intimate Partner Violence: Not on file    Review of Systems: See HPI, otherwise negative ROS  Physical Exam: BP (!) 163/84   Pulse 76   Temp (!) 97.4 F (36.3 C) (Temporal)   Resp 16   Ht '5\' 10"'$  (1.778 m)   Wt 88.5 kg   SpO2 97%   BMI 27.98 kg/m  General:   Alert,  pleasant and cooperative in NAD Head:  Normocephalic and atraumatic. Lungs:  Clear to auscultation.    Heart:  Regular rate and rhythm.   Impression/Plan: John Mueller is here for ophthalmic surgery.  Risks, benefits, limitations, and alternatives regarding ophthalmic surgery have been reviewed with the patient.  Questions have been answered.  All parties agreeable.   Leandrew Koyanagi, MD  02/27/2022, 7:32 AM

## 2022-02-27 NOTE — Anesthesia Preprocedure Evaluation (Signed)
Anesthesia Evaluation  Patient identified by MRN, date of birth, ID band Patient awake    Reviewed: Allergy & Precautions, NPO status , Patient's Chart, lab work & pertinent test results  History of Anesthesia Complications Negative for: history of anesthetic complications  Airway Mallampati: III  TM Distance: >3 FB Neck ROM: full    Dental  (+) Upper Dentures, Lower Dentures   Pulmonary COPD, former smoker   Pulmonary exam normal        Cardiovascular Exercise Tolerance: Good hypertension, (-) angina Normal cardiovascular exam     Neuro/Psych negative neurological ROS  negative psych ROS   GI/Hepatic Neg liver ROS,GERD  Controlled,,  Endo/Other  negative endocrine ROS    Renal/GU      Musculoskeletal   Abdominal   Peds  Hematology negative hematology ROS (+)   Anesthesia Other Findings Past Medical History: No date: Arthritis No date: COPD (chronic obstructive pulmonary disease) (HCC) No date: GERD (gastroesophageal reflux disease) No date: Hyperlipidemia No date: Hypertension No date: Vertigo     Comment:  about 1x/year No date: Wears dentures     Comment:  full upper and lower No date: Wears hearing aid in right ear  Past Surgical History: No date: CARDIAC CATHETERIZATION 02/13/2022: CATARACT EXTRACTION W/PHACO; Left     Comment:  Procedure: CATARACT EXTRACTION PHACO AND INTRAOCULAR               LENS PLACEMENT ([place]) Left Diabetes  13.52  01:32.1;                Surgeon: Leandrew Koyanagi, MD;  Location: [place];  Service: Ophthalmology;  Laterality: Left; No date: COLONOSCOPY 11/14/2017: COLONOSCOPY WITH PROPOFOL; N/A     Comment:  Procedure: COLONOSCOPY WITH PROPOFOL;  Surgeon:               Lollie Sails, MD;  Location: [facility] ENDOSCOPY;                Service: Endoscopy;  Laterality: N/A; 11/13/2020: COLONOSCOPY WITH PROPOFOL; N/A     Comment:  Procedure:  COLONOSCOPY WITH PROPOFOL;  Surgeon: Annamaria Helling, DO;  Location: [facility] ENDOSCOPY;  Service:               Gastroenterology;  Laterality: N/A; No date: FOOT SURGERY No date: HERNIA REPAIR 08/2015: SHOULDER ARTHROSCOPY WITH ROTATOR CUFF REPAIR; Right  BMI    Body Mass Index: 27.98 kg/m      Reproductive/Obstetrics negative OB ROS                             Anesthesia Physical Anesthesia Plan  ASA: 3  Anesthesia Plan: MAC   Post-op Pain Management:    Induction: Intravenous  PONV Risk Score and Plan:   Airway Management Planned: Natural Airway and Nasal Cannula  Additional Equipment:   Intra-op Plan:   Post-operative Plan:   Informed Consent: I have reviewed the patients History and Physical, chart, labs and discussed the procedure including the risks, benefits and alternatives for the proposed anesthesia with the patient or authorized representative who has indicated his/her understanding and acceptance.     Dental Advisory Given  Plan Discussed with: Anesthesiologist, CRNA and Surgeon  Anesthesia Plan Comments: (Patient consented for risks of anesthesia including but not limited  to:  - adverse reactions to medications - damage to eyes, teeth, lips or other oral mucosa - nerve damage due to positioning  - sore throat or hoarseness - Damage to heart, brain, nerves, lungs, other parts of body or loss of life  Patient voiced understanding.)       Anesthesia Quick Evaluation

## 2022-02-27 NOTE — Transfer of Care (Signed)
Immediate Anesthesia Transfer of Care Note  Patient: John Mueller  Procedure(s) Performed: CATARACT EXTRACTION PHACO AND INTRAOCULAR LENS PLACEMENT RIGHT EYE DIABETES (Right: Eye)  Patient Location: PACU  Anesthesia Type: MAC  Level of Consciousness: awake, alert  and patient cooperative  Airway and Oxygen Therapy: Patient Spontanous Breathing and Patient connected to supplemental oxygen  Post-op Assessment: Post-op Vital signs reviewed, Patient's Cardiovascular Status Stable, Respiratory Function Stable, Patent Airway and No signs of Nausea or vomiting  Post-op Vital Signs: Reviewed and stable  Complications: No notable events documented.

## 2022-02-27 NOTE — Op Note (Signed)
LOCATION:  [place]   PREOPERATIVE DIAGNOSIS:    Nuclear sclerotic cataract right eye. H25.11   POSTOPERATIVE DIAGNOSIS:  Nuclear sclerotic cataract right eye.     PROCEDURE:  Phacoemusification with posterior chamber intraocular lens placement of the right eye   ULTRASOUND TIME: Procedure(s) with comments: CATARACT EXTRACTION PHACO AND INTRAOCULAR LENS PLACEMENT RIGHT EYE DIABETES (Right) - 8.81 1:15.3  LENS:   Implant Name Type Inv. Item Serial No. Manufacturer Lot No. LRB No. Used Action  LENS IOL TECNIS EYHANCE 20.0 - [id number] Intraocular Lens LENS IOL TECNIS EYHANCE 20.0 [id number] [employer]  Right 1 Implanted         SURGEON:  Wyonia Hough, MD   ANESTHESIA:  Topical with tetracaine drops and 2% Xylocaine jelly, augmented with 1% preservative-free intracameral lidocaine.    COMPLICATIONS:  None.   DESCRIPTION OF PROCEDURE:  The patient was identified in the holding room and transported to the operating room and placed in the supine position under the operating microscope.  The right eye was identified as the operative eye and it was prepped and draped in the usual sterile ophthalmic fashion.   A 1 millimeter clear-corneal paracentesis was made at the 12:00 position.  0.5 ml of preservative-free 1% lidocaine was injected into the anterior chamber. The anterior chamber was filled with Viscoat viscoelastic.  A 2.4 millimeter keratome was used to make a near-clear corneal incision at the 9:00 position.  A curvilinear capsulorrhexis was made with a cystotome and capsulorrhexis forceps.  Balanced salt solution was used to hydrodissect and hydrodelineate the nucleus.   Phacoemulsification was then used in stop and chop fashion to remove the lens nucleus and epinucleus.  The remaining cortex was then removed using the irrigation and aspiration handpiece. Provisc was then placed into the capsular bag to distend it for lens placement.  A lens was then injected into  the capsular bag.  The remaining viscoelastic was aspirated.   Wounds were hydrated with balanced salt solution.  The anterior chamber was inflated to a physiologic pressure with balanced salt solution.  No wound leaks were noted. Cefuroxime 0.1 ml of a '10mg'$ /ml solution was injected into the anterior chamber for a dose of 1 mg of intracameral antibiotic at the completion of the case.   Timolol and Brimonidine drops were applied to the eye.  The patient was taken to the recovery room in stable condition without complications of anesthesia or surgery.   Ludie Pavlik 02/27/2022, 7:59 AM

## 2022-02-27 NOTE — Anesthesia Postprocedure Evaluation (Signed)
Anesthesia Post Note  Patient: John Mueller  Procedure(s) Performed: CATARACT EXTRACTION PHACO AND INTRAOCULAR LENS PLACEMENT RIGHT EYE DIABETES (Right: Eye)  Patient location during evaluation: PACU Anesthesia Type: MAC Level of consciousness: awake and alert Pain management: pain level controlled Vital Signs Assessment: post-procedure vital signs reviewed and stable Respiratory status: spontaneous breathing, nonlabored ventilation, respiratory function stable and patient connected to nasal cannula oxygen Cardiovascular status: blood pressure returned to baseline and stable Postop Assessment: no apparent nausea or vomiting Anesthetic complications: no   No notable events documented.   Last Vitals:  Vitals:   02/27/22 0801 02/27/22 0806  BP: (!) 143/91 (!) 144/83  Pulse: 67 64  Resp: 16 16  Temp: (!) 36.2 C (!) 36.2 C  SpO2: 95% 95%    Last Pain:  Vitals:   02/27/22 0806  TempSrc:   PainSc: 0-No pain                 John Mueller

## 2022-02-28 ENCOUNTER — Encounter: Payer: Self-pay | Admitting: Ophthalmology

## 2023-03-21 ENCOUNTER — Other Ambulatory Visit: Payer: Self-pay | Admitting: Otolaryngology

## 2023-03-21 DIAGNOSIS — H905 Unspecified sensorineural hearing loss: Secondary | ICD-10-CM

## 2023-04-02 ENCOUNTER — Ambulatory Visit
Admission: RE | Admit: 2023-04-02 | Discharge: 2023-04-02 | Disposition: A | Discharge status: Home / Self Care | Payer: Medicare PPO | Source: Ambulatory Visit | Attending: Otolaryngology | Admitting: Otolaryngology

## 2023-04-02 ENCOUNTER — Ambulatory Visit: Admission: RE | Admit: 2023-04-02 | Payer: Medicare PPO | Source: Ambulatory Visit

## 2023-04-02 DIAGNOSIS — H905 Unspecified sensorineural hearing loss: Secondary | ICD-10-CM | POA: Insufficient documentation

## 2023-04-02 MED ORDER — GADOBUTROL 1 MMOL/ML IV SOLN
9.0000 mL | Freq: Once | INTRAVENOUS | Status: AC | PRN
  Administered 2023-04-02: 9 mL via INTRAVENOUS
